# Patient Record
Sex: Female | Born: 2000
Health system: Southern US, Community
[De-identification: ages and names within clinical notes are randomized; demographics above are authoritative.]

## PROBLEM LIST (undated history)

## (undated) DIAGNOSIS — F419 Anxiety disorder, unspecified: Secondary | ICD-10-CM

## (undated) DIAGNOSIS — K219 Gastro-esophageal reflux disease without esophagitis: Secondary | ICD-10-CM

---
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
## 2021-02-28 ENCOUNTER — Ambulatory Visit: Payer: Self-pay

## 2021-03-08 ENCOUNTER — Other Ambulatory Visit: Payer: Self-pay

## 2021-03-08 ENCOUNTER — Ambulatory Visit
Admission: RE | Admit: 2021-03-08 | Discharge: 2021-03-08 | Disposition: A | Discharge status: Home / Self Care | Payer: Managed Care, Other (non HMO) | Source: Ambulatory Visit | Attending: Family Medicine | Admitting: Family Medicine

## 2021-03-08 VITALS — BP 128/91 | HR 108 | Temp 98.8°F | Resp 19

## 2021-03-08 DIAGNOSIS — J019 Acute sinusitis, unspecified: Secondary | ICD-10-CM | POA: Diagnosis not present

## 2021-03-08 MED ORDER — AMOXICILLIN-POT CLAVULANATE 875-125 MG PO TABS: 1.0000 | ORAL_TABLET | Freq: Two times a day (BID) | ORAL | 0 refills | Status: DC

## 2021-03-08 MED ORDER — CETIRIZINE-PSEUDOEPHEDRINE ER 5-120 MG PO TB12: 1.0000 | ORAL_TABLET | Freq: Two times a day (BID) | ORAL | 0 refills | Status: DC

## 2021-03-08 MED ORDER — FLUCONAZOLE 150 MG PO TABS: 150.0000 mg | ORAL_TABLET | Freq: Once | ORAL | 0 refills | Status: AC

## 2021-03-08 NOTE — ED Triage Notes (Addendum)
Pt presents with complaints of nasal congestion x 2 weeks and non productive cough. Patient states that her congestion get significantly worse at night. Denies any headache, pains, or fever. Reports she went to her doctor 2 days ago and was started on a zpack with no relief. Reports using Flonase and a humidifier with no relief.

## 2021-03-08 NOTE — ED Provider Notes (Signed)
[facility]    CSN: [id number] Arrival date & time: 03/08/21  1856      History   Chief Complaint Chief Complaint  Patient presents with  . Nasal Congestion   HPI  20 year old female presents with congestion and cough.  2-week history of symptoms.  She reports severe nasal congestion which is worse at night.  Associated nonproductive cough.  No fever.  She has taken Flonase and used a Nettie pot as well as occasional Afrin without relief.  Was recently put on azithromycin and has had no improvement.  No fever.  No reported sick contacts.  No relieving factors.  No other complaints.  Past Medical History:  Diagnosis Date  . Acid reflux   . Anxiety     Home Medications    Prior to Admission medications   Medication Sig Start Date End Date Taking? Authorizing Provider  amoxicillin-clavulanate (AUGMENTIN) 875-125 MG tablet Take 1 tablet by mouth 2 (two) times daily. 03/08/21  Yes Priyah Schmuck G, DO  cetirizine-pseudoephedrine (ZYRTEC-D) 5-120 MG tablet Take 1 tablet by mouth 2 (two) times daily. 03/08/21  Yes Zakaiya Lares G, DO  fluconazole (DIFLUCAN) 150 MG tablet Take 1 tablet (150 mg total) by mouth once for 1 dose. Repeat dose in 72 hours. 03/08/21 03/08/21 Yes Deyna Carbon G, DO  pantoprazole (PROTONIX) 40 MG tablet Take by mouth. 07/26/20 01/22/21  [provider]  dicyclomine (BENTYL) 10 MG capsule Take by mouth. 07/28/20 03/08/21  [provider]  escitalopram (LEXAPRO) 5 MG tablet Take 5 mg by mouth daily. 07/29/20 03/08/21  [provider]  sucralfate (CARAFATE) 1 GM/10ML suspension Take by mouth. 07/05/20 03/08/21  [provider]    Family History Family History  Problem Relation Age of Onset  . Healthy Mother   . Healthy Father     Social History Social History   Tobacco Use  . Smoking status: Never Smoker  . Smokeless tobacco: Never Used  Substance Use Topics  . Alcohol use: Never  . Drug use: Never     Allergies    Patient has no known allergies.   Review of Systems Review of Systems  HENT: Positive for congestion.   Respiratory: Positive for cough.    Physical Exam Triage Vital Signs ED Triage Vitals  Enc Vitals Group     BP 03/08/21 1905 (!) 128/91     Pulse Rate 03/08/21 1905 (!) 108     Resp 03/08/21 1905 19     Temp 03/08/21 1905 98.8 F (37.1 C)     Temp src --      SpO2 03/08/21 1905 99 %     Weight --      Height --      Head Circumference --      Peak Flow --      Pain Score 03/08/21 1903 0     Pain Loc --      Pain Edu? --      Excl. in GC? --    Updated Vital Signs BP (!) 128/91   Pulse (!) 108   Temp 98.8 F (37.1 C)   Resp 19   LMP 02/22/2021   SpO2 99%   Visual Acuity Right Eye Distance:   Left Eye Distance:   Bilateral Distance:    Right Eye Near:   Left Eye Near:    Bilateral Near:     Physical Exam Vitals and nursing note reviewed.  Constitutional:      General: She is  not in acute distress.    Appearance: Normal appearance. She is not ill-appearing.  HENT:     Head: Normocephalic and atraumatic.     Right Ear: Tympanic membrane normal.     Left Ear: Tympanic membrane normal.     Nose: Congestion present.  Eyes:     General:        Right eye: No discharge.        Left eye: No discharge.     Conjunctiva/sclera: Conjunctivae normal.  Cardiovascular:     Rate and Rhythm: Regular rhythm. Tachycardia present.  Pulmonary:     Effort: Pulmonary effort is normal.     Breath sounds: Normal breath sounds. No wheezing, rhonchi or rales.  Neurological:     Mental Status: She is alert.  Psychiatric:        Mood and Affect: Mood normal.        Behavior: Behavior normal.    UC Treatments / Results  Labs (all labs ordered are listed, but only abnormal results are displayed) Labs Reviewed - No data to display  EKG   Radiology No results found.  Procedures Procedures (including critical care time)  Medications Ordered in UC Medications -  No data to display  Initial Impression / Assessment and Plan / UC Course  I have reviewed the triage vital signs and the nursing notes.  Pertinent labs & imaging results that were available during my care of the patient were reviewed by me and considered in my medical decision making (see chart for details).    20 year old female presents with sinusitis.  Stop azithromycin.  Start Augmentin.  Zyrtec-D as prescribed.  Diflucan if needed for yeast vaginitis.  Final Clinical Impressions(s) / UC Diagnoses   Final diagnoses:  Acute sinusitis, recurrence not specified, unspecified location     Discharge Instructions     Medications as prescribed.  Cal with concerns.  Take care  Dr. Adriana Simas   ED Prescriptions    Medication Sig Dispense Auth. Provider   amoxicillin-clavulanate (AUGMENTIN) 875-125 MG tablet Take 1 tablet by mouth 2 (two) times daily. 20 tablet Carrolyn Hilmes G, DO   fluconazole (DIFLUCAN) 150 MG tablet Take 1 tablet (150 mg total) by mouth once for 1 dose. Repeat dose in 72 hours. 2 tablet Adria Costley G, DO   cetirizine-pseudoephedrine (ZYRTEC-D) 5-120 MG tablet Take 1 tablet by mouth 2 (two) times daily. 30 tablet Tommie Sams, DO     PDMP not reviewed this encounter.   Tommie Sams, [place] 03/08/21 2010

## 2021-03-08 NOTE — Discharge Instructions (Signed)
Medications as prescribed.  Cal with concerns.  Take care  Dr. Adriana Simas

## 2021-03-18 ENCOUNTER — Ambulatory Visit: Payer: Self-pay

## 2021-05-03 ENCOUNTER — Ambulatory Visit
Admission: RE | Admit: 2021-05-03 | Discharge: 2021-05-03 | Disposition: A | Discharge status: Home / Self Care | Payer: Managed Care, Other (non HMO) | Source: Ambulatory Visit | Attending: Physician Assistant | Admitting: Physician Assistant

## 2021-05-03 ENCOUNTER — Other Ambulatory Visit: Payer: Self-pay

## 2021-05-03 ENCOUNTER — Ambulatory Visit (INDEPENDENT_AMBULATORY_CARE_PROVIDER_SITE_OTHER): Payer: Managed Care, Other (non HMO)

## 2021-05-03 VITALS — BP 137/77 | HR 94 | Temp 98.4°F | Resp 18 | Wt 170.0 lb

## 2021-05-03 DIAGNOSIS — R0989 Other specified symptoms and signs involving the circulatory and respiratory systems: Secondary | ICD-10-CM | POA: Diagnosis not present

## 2021-05-03 DIAGNOSIS — R0982 Postnasal drip: Secondary | ICD-10-CM | POA: Diagnosis not present

## 2021-05-03 DIAGNOSIS — J309 Allergic rhinitis, unspecified: Secondary | ICD-10-CM | POA: Diagnosis not present

## 2021-05-03 DIAGNOSIS — R059 Cough, unspecified: Secondary | ICD-10-CM

## 2021-05-03 MED ORDER — IPRATROPIUM BROMIDE 0.06 % NA SOLN: 2.0000 | Freq: Four times a day (QID) | NASAL | 1 refills | Status: DC

## 2021-05-03 MED ORDER — MONTELUKAST SODIUM 10 MG PO TABS: 10.0000 mg | ORAL_TABLET | Freq: Every day | ORAL | 0 refills | Status: DC

## 2021-05-03 NOTE — ED Provider Notes (Signed)
[facility]    CSN: [id number] Arrival date & time: 05/03/21  1829      History   Chief Complaint Chief Complaint  Patient presents with  . Cough  . Nasal Congestion    HPI MIQUELA COSTABILE is a 20 y.o. female presenting with her mother for approximately 23-month history of cough and nasal congestion.  Patient has taken antibiotics multiple times and states symptoms improve when she takes antibiotics but then return after..  Patient was seen at [facility] urgent care 2 months ago and treated with Augmentin for suspected sinus infection.  She reported that she was treated with azithromycin before that.  Patient was recently treated last week with azithromycin.  She takes daily Zyrtec-D and uses Flonase every day.  She also occasionally uses nasal saline rinses.  She has a humidifier at her bedside.  Patient has reportedly been diagnosed with sinus infections by her PCP and given azithromycin on multiple occasions.  She has not had any allergy testing.  Patient denies any acute worsening of her symptoms recently, but concerned because they are ongoing so long.  She has not had any fevers, fatigue, achiness, sore throat, chest pain, shortness of breath.  Patient recently got a new puppy couple months ago but has had a dog before that and never had any sort of symptoms such as these.  She has not been knowingly exposed to any mold or anything else that could be causing her to have allergies other than seasonal.  Past medical history significant for anxiety.  She takes Lexapro for this.  Additionally, she has history of chronic chest pain related to acid reflux and recurrent costochondritis.  Patient takes Protonix for her acid reflux.  These conditions are reportedly under good control.  There are no other concerns today.  HPI  Past Medical History:  Diagnosis Date  . Acid reflux   . Anxiety     There are no problems to display for this patient.   History reviewed. No pertinent  surgical history.  OB History   No obstetric history on file.      Home Medications    Prior to Admission medications   Medication Sig Start Date End Date Taking? Authorizing Provider  cetirizine-pseudoephedrine (ZYRTEC-D) 5-120 MG tablet Take 1 tablet by mouth 2 (two) times daily. 03/08/21  Yes Cook, Jayce G, DO  ipratropium (ATROVENT) 0.06 % nasal spray Place 2 sprays into both nostrils 4 (four) times daily for 15 days. 05/03/21 05/18/21 Yes Eusebio Friendly B, PA-C  montelukast (SINGULAIR) 10 MG tablet Take 1 tablet (10 mg total) by mouth at bedtime. 05/03/21 06/02/21 Yes Eusebio Friendly B, PA-C  pantoprazole (PROTONIX) 40 MG tablet Take by mouth. 07/26/20 01/22/21  [provider]  dicyclomine (BENTYL) 10 MG capsule Take by mouth. 07/28/20 03/08/21  [provider]  escitalopram (LEXAPRO) 5 MG tablet Take 5 mg by mouth daily. 07/29/20 03/08/21  [provider]  sucralfate (CARAFATE) 1 GM/10ML suspension Take by mouth. 07/05/20 03/08/21  [provider]    Family History Family History  Problem Relation Age of Onset  . Healthy Mother   . Healthy Father     Social History Social History   Tobacco Use  . Smoking status: Never Smoker  . Smokeless tobacco: Never Used  Substance Use Topics  . Alcohol use: Never  . Drug use: Never     Allergies   Patient has no known allergies.   Review of Systems Review of Systems  Constitutional: Negative for chills, diaphoresis, fatigue and fever.  HENT: Positive for congestion, postnasal drip and rhinorrhea. Negative for ear pain, sinus pressure, sinus pain and sore throat.   Respiratory: Positive for cough. Negative for shortness of breath.   Cardiovascular: Negative for chest pain.  Gastrointestinal: Negative for abdominal pain, nausea and vomiting.  Musculoskeletal: Negative for arthralgias and myalgias.  Skin: Negative for rash.  Neurological: Negative for weakness and headaches.  Hematological: Negative for  adenopathy.     Physical Exam Triage Vital Signs ED Triage Vitals [05/03/21 1841]  Enc Vitals Group     BP      Pulse      Resp      Temp      Temp src      SpO2      Weight 170 lb (77.1 kg)     Height      Head Circumference      Peak Flow      Pain Score 0     Pain Loc      Pain Edu?      Excl. in GC?    No data found.  Updated Vital Signs BP 137/77 (BP Location: Left Arm)   Pulse 94   Temp 98.4 F (36.9 C) (Oral)   Resp 18   Wt 170 lb (77.1 kg)   LMP 04/26/2021   SpO2 100%   BMI 28.29 kg/m    Physical Exam Vitals and nursing note reviewed.  Constitutional:      General: She is not in acute distress.    Appearance: Normal appearance. She is not ill-appearing or toxic-appearing.  HENT:     Head: Normocephalic and atraumatic.     Right Ear: Tympanic membrane, ear canal and external ear normal.     Left Ear: Tympanic membrane, ear canal and external ear normal.     Nose: Congestion present. No rhinorrhea.     Mouth/Throat:     Mouth: Mucous membranes are moist.     Pharynx: Oropharynx is clear. Posterior oropharyngeal erythema (mild with trace clear PND) present.  Eyes:     General: No scleral icterus.       Right eye: No discharge.        Left eye: No discharge.     Conjunctiva/sclera: Conjunctivae normal.  Cardiovascular:     Rate and Rhythm: Normal rate and regular rhythm.     Heart sounds: Normal heart sounds.  Pulmonary:     Effort: Pulmonary effort is normal. No respiratory distress.     Breath sounds: Normal breath sounds. No wheezing, rhonchi or rales.  Musculoskeletal:     Cervical back: Neck supple.  Skin:    General: Skin is dry.  Neurological:     General: No focal deficit present.     Mental Status: She is alert. Mental status is at baseline.     Motor: No weakness.     Gait: Gait normal.  Psychiatric:        Mood and Affect: Mood normal.        Behavior: Behavior normal.        Thought Content: Thought content normal.      UC  Treatments / Results  Labs (all labs ordered are listed, but only abnormal results are displayed) Labs Reviewed - No data to display  EKG   Radiology DG Chest 2 View  Result Date: 05/03/2021 CLINICAL DATA:  Cough congestion EXAM: CHEST - 2 VIEW COMPARISON:  10/28/2020 FINDINGS: The heart  size and mediastinal contours are within normal limits. Both lungs are clear. The visualized skeletal structures are unremarkable. IMPRESSION: No active cardiopulmonary disease. Electronically Signed   By: Jasmine Pang M.D.   On: 05/03/2021 19:11    Procedures Procedures (including critical care time)  Medications Ordered in UC Medications - No data to display  Initial Impression / Assessment and Plan / UC Course  I have reviewed the triage vital signs and the nursing notes.  Pertinent labs & imaging results that were available during my care of the patient were reviewed by me and considered in my medical decision making (see chart for details).   20 year old female presenting for 42-month history of cough, congestion and postnasal drainage.  All vital signs are normal and stable in the clinic today.  She does have some minor nasal congestion on exam without any rhinorrhea.  She does have mild posterior pharyngeal erythema with trace clear postnasal drainage.  Her chest is clear to auscultation and heart regular rate and rhythm.  Obtaining a chest x-ray due to duration of symptoms.  Chest x-ray reviewed by me.  Overread interpretation is within normal limits.  Reviewed this result with patient and parent.  Advised patient that her symptoms are most consistent with allergic rhinitis, most likely seasonal but could be due to other causes such as pet dander, molds.  Advised to continue taking the Zyrtec-D and using nasal saline.  Advised can try Atrovent nasal spray instead this time.  We will try her on Singulair at bedtime to see if that helps.  Advised she can also take Benadryl if needed here and there.   Advised to make sure she stays hydrated to continue using the humidifier.  Advised if symptoms continue on in December and then she should see her PCP again and request possible referral to allergist or have allergy testing panel performed.  Reviewed signs and symptoms of acute bacterial sinus infection with patient and advised her to return for any other symptoms.  She denies any sinus pain.  Advised to follow-up with [place] as needed, otherwise to follow-up PCP regarding these persistent symptoms.  Final Clinical Impressions(s) / UC Diagnoses   Final diagnoses:  Allergic rhinitis, unspecified seasonality, unspecified trigger  Cough  Post-nasal drainage     Discharge Instructions     Your symptoms seem most consistent with seasonal type allergies to me.  Continue the Zyrtec the and nasal saline.  We can try another nasal spray and Singulair.  You can also take Benadryl at bedtime.  Continue to use your humidifier.  Your symptoms should improve and eventually resolve once we get into summer.  However, if it continues you may need to be tested for certain allergies to see if it is a response to something else such as pet dander.      ED Prescriptions    Medication Sig Dispense Auth. Provider   ipratropium (ATROVENT) 0.06 % nasal spray Place 2 sprays into both nostrils 4 (four) times daily for 15 days. 12 mL Eusebio Friendly B, PA-C   montelukast (SINGULAIR) 10 MG tablet Take 1 tablet (10 mg total) by mouth at bedtime. 30 tablet Gareth Morgan     PDMP not reviewed this encounter.   Shirlee Latch, PA-C 05/03/21 1936

## 2021-05-03 NOTE — ED Triage Notes (Signed)
Patient c/o cough and congestion that started over 2 months ago. She states she will take antibiotics and it will get better and then her symptoms will return.

## 2021-05-03 NOTE — Discharge Instructions (Signed)
Your symptoms seem most consistent with seasonal type allergies to me.  Continue the Zyrtec the and nasal saline.  We can try another nasal spray and Singulair.  You can also take Benadryl at bedtime.  Continue to use your humidifier.  Your symptoms should improve and eventually resolve once we get into summer.  However, if it continues you may need to be tested for certain allergies to see if it is a response to something else such as pet dander.

## 2021-11-13 ENCOUNTER — Other Ambulatory Visit: Payer: Self-pay

## 2021-11-13 ENCOUNTER — Ambulatory Visit
Admission: EM | Admit: 2021-11-13 | Discharge: 2021-11-13 | Disposition: A | Discharge status: Home / Self Care | Payer: Managed Care, Other (non HMO) | Attending: Emergency Medicine | Admitting: Emergency Medicine

## 2021-11-13 DIAGNOSIS — Z20822 Contact with and (suspected) exposure to covid-19: Secondary | ICD-10-CM | POA: Diagnosis not present

## 2021-11-13 DIAGNOSIS — J029 Acute pharyngitis, unspecified: Secondary | ICD-10-CM

## 2021-11-13 LAB — RESP PANEL BY RT-PCR (FLU A&B, COVID) ARPGX2
Influenza A by PCR: NEGATIVE
Influenza B by PCR: NEGATIVE
SARS Coronavirus 2 by RT PCR: NEGATIVE

## 2021-11-13 LAB — GROUP A STREP BY PCR: Group A Strep by PCR: NOT DETECTED

## 2021-11-13 MED ORDER — IBUPROFEN 600 MG PO TABS: 600.0000 mg | ORAL_TABLET | Freq: Four times a day (QID) | ORAL | 0 refills | Status: DC | PRN

## 2021-11-13 NOTE — Discharge Instructions (Addendum)
Your strep PCR is negative.  We are going to check a rapid flu on you, I will contact you if it is positive and will call in Tamiflu.  The COVID is a send out test.  Will be back tomorrow.  I will prescribe Molnupiravir if it is positive.  In the meantime, 1 gram of Tylenol and 600 mg ibuprofen together 3-4 times a day as needed for pain.  Make sure you drink plenty of extra fluids.  Some people find salt water gargles and  Traditional Medicinal's "Throat Coat" tea helpful. Take 5 mL of liquid Benadryl and 5 mL of Maalox. Mix it together, and then hold it in your mouth for as long as you can and then swallow. You may do this 4 times a day.    Go to www.goodrx.com  or www.costplusdrugs.com to look up your medications. This will give you a list of where you can find your prescriptions at the most affordable prices. Or ask the pharmacist what the cash price is, or if they have any other discount programs available to help make your medication more affordable. This can be less expensive than what you would pay with insurance.

## 2021-11-13 NOTE — ED Provider Notes (Signed)
HPI  SUBJECTIVE:  Patient reports sore throat starting yesterday.  No aggravating or alleviating factors.  She has tried Alka-Seltzer cold and flu without improvement in her symptoms. No fever   No neck stiffness  No Cough No nasal congestion, rhinorrhea No Myalgias No Headache No Rash  No loss of taste or smell No shortness of breath or difficulty breathing No nausea, vomiting No diarrhea No abdominal pain     No Recent Strep, flu, COVID exposure She did not get the COVID-vaccine.  She got the flu vaccine. No reflux sxs No Allergy sxs  No Breathing difficulty, voice changes, sensation of throat swelling shut No Drooling No Trismus No abx in past month.  No antipyretic in the past 6 hours. Past medical history of frequent strep.   LMP: 2 weeks ago.  Denies possibility being pregnant. PMD: [facility].   Past Medical History:  Diagnosis Date   Acid reflux    Anxiety     History reviewed. No pertinent surgical history.  Family History  Problem Relation Age of Onset   Healthy Mother    Healthy Father     Social History   Tobacco Use   Smoking status: Never   Smokeless tobacco: Never  Vaping Use   Vaping Use: Never used  Substance Use Topics   Alcohol use: Never   Drug use: Never    No current facility-administered medications for this encounter.  Current Outpatient Medications:    ibuprofen (ADVIL) 600 MG tablet, Take 1 tablet (600 mg total) by mouth every 6 (six) hours as needed., Disp: 30 tablet, Rfl: 0   ipratropium (ATROVENT) 0.06 % nasal spray, Place 2 sprays into both nostrils 4 (four) times daily for 15 days., Disp: 12 mL, Rfl: 1   montelukast (SINGULAIR) 10 MG tablet, Take 1 tablet (10 mg total) by mouth at bedtime., Disp: 30 tablet, Rfl: 0   pantoprazole (PROTONIX) 40 MG tablet, Take by mouth., Disp: , Rfl:   No Known Allergies   ROS  As noted in HPI.   Physical Exam  BP 123/75 (BP Location: Left Arm)   Pulse 79   Temp 97.9  F (36.6 C) (Oral)   Resp 16   Ht 5\' 5"  (1.651 m)   Wt 77.1 kg   LMP 10/30/2021   SpO2 99%   BMI 28.29 kg/m   Constitutional: Well developed, well nourished, no acute distress Eyes:  EOMI, conjunctiva normal bilaterally HENT: Normocephalic, atraumatic,mucus membranes moist. +  nasal congestion + slightly erythematous oropharynx normal tonsils without exudates.   Uvula midline.  Respiratory: Normal inspiratory effort Cardiovascular: Normal rate, no murmurs, rubs, gallops GI: nondistended, nontender. No appreciable splenomegaly skin: No rash, skin intact Lymph: -  Anterior cervical LN.  No posterior cervical lymphadenopathy Musculoskeletal: no deformities Neurologic: Alert & oriented x 3, no focal neuro deficits Psychiatric: Speech and behavior appropriate. ED Course   Medications - No data to display  Orders Placed This Encounter  Procedures   Group A Strep by PCR    Standing Status:   Standing    Number of Occurrences:   1   Resp Panel by RT-PCR (Flu A&B, Covid) Nasopharyngeal Swab    Standing Status:   Standing    Number of Occurrences:   1   Airborne and Contact precautions    Standing Status:   Standing    Number of Occurrences:   1    Results for orders placed or performed during the hospital encounter of 11/13/21 (from the  past 24 hour(s))  Group A Strep by PCR     Status: None   Collection Time: 11/13/21  8:59 AM   Specimen: Throat; Sterile Swab  Result Value Ref Range   Group A Strep by PCR NOT DETECTED NOT DETECTED  Resp Panel by RT-PCR (Flu A&B, Covid) Nasopharyngeal Swab     Status: None   Collection Time: 11/13/21  9:50 AM   Specimen: Nasopharyngeal Swab; Nasopharyngeal(NP) swabs in vial transport medium  Result Value Ref Range   SARS Coronavirus 2 by RT PCR NEGATIVE NEGATIVE   Influenza A by PCR NEGATIVE NEGATIVE   Influenza B by PCR NEGATIVE NEGATIVE   No results found.  ED Clinical Impression  1. Acute pharyngitis, unspecified etiology   2.  Encounter for laboratory testing for COVID-19 virus      ED Assessment/Plan  Strep PCR negative.  COVID, flu PCR also negative.  Suspect viral pharyngitis.  Tylenol/ibuprofen, Benadryl/Maalox mixture, work note for tomorrow.  ER return precautions given .  Discussed labs,  MDM, plan and followup with patient. Discussed sn/sx that should prompt return to the ED. patient agrees with plan.   Meds ordered this encounter  Medications   ibuprofen (ADVIL) 600 MG tablet    Sig: Take 1 tablet (600 mg total) by mouth every 6 (six) hours as needed.    Dispense:  30 tablet    Refill:  0     *This clinic note was created using Scientist, clinical (histocompatibility and immunogenetics). Therefore, there may be occasional mistakes despite careful proofreading.     Domenick Gong, MD 11/14/21 [phone]

## 2021-11-13 NOTE — ED Triage Notes (Signed)
Pt here with C/O Sore throat since yesterday. Denies fever, nasal congestion or chest congestion.

## 2021-11-16 ENCOUNTER — Other Ambulatory Visit: Payer: Self-pay

## 2021-11-16 ENCOUNTER — Ambulatory Visit
Admission: EM | Admit: 2021-11-16 | Discharge: 2021-11-16 | Disposition: A | Discharge status: Home / Self Care | Payer: Managed Care, Other (non HMO) | Attending: Student | Admitting: Student

## 2021-11-16 ENCOUNTER — Encounter: Payer: Self-pay | Admitting: Emergency Medicine

## 2021-11-16 DIAGNOSIS — J069 Acute upper respiratory infection, unspecified: Secondary | ICD-10-CM

## 2021-11-16 DIAGNOSIS — J029 Acute pharyngitis, unspecified: Secondary | ICD-10-CM

## 2021-11-16 MED ORDER — LIDOCAINE VISCOUS HCL 2 % MT SOLN: 15.0000 mL | OROMUCOSAL | 0 refills | Status: DC | PRN

## 2021-11-16 NOTE — Discharge Instructions (Addendum)
-  Continue Z-pak as previously prescribed. -Use viscous lidocaine as needed for sore throat. -Follow-up if no improvement of symptoms.

## 2021-11-16 NOTE — ED Provider Notes (Signed)
[facility]    CSN: [id number] Arrival date & time: 11/16/21  1720      History   Chief Complaint Chief Complaint  Patient presents with   Sore Throat    HPI Marissa Harrington is a 20 y.o. female who presents today for evaluation of ongoing sore throat.  The patient was evaluated 3 days ago at the [facility] urgent care where she underwent COVID testing which was negative, influenza negative and negative strep.  The patient was then evaluated by her primary care yesterday who started the patient on a Z-Pak for upper respiratory infection.  The patient presents today denying any changes to her symptoms.  She states that her sore throat is slightly worse but she reports no significant worsening of her symptoms and no improvement at this time.  The patient does report a runny nose, she does report a mild headache.  She does report a productive cough.  She denies any chest pain or shortness of breath.  No vision changes.  No hearing changes at this time.  She denies any fevers or chills at home.   Sore Throat Pertinent negatives include no chest pain and no shortness of breath.   Past Medical History:  Diagnosis Date   Acid reflux    Anxiety     There are no problems to display for this patient.   History reviewed. No pertinent surgical history.  OB History   No obstetric history on file.      Home Medications    Prior to Admission medications   Medication Sig Start Date End Date Taking? Authorizing Provider  ibuprofen (ADVIL) 600 MG tablet Take 1 tablet (600 mg total) by mouth every 6 (six) hours as needed. 11/13/21  Yes Domenick Gong, MD  lidocaine (XYLOCAINE) 2 % solution Use as directed 15 mLs in the mouth or throat as needed for mouth pain. 11/16/21  Yes Anson Oregon, PA-C  ipratropium (ATROVENT) 0.06 % nasal spray Place 2 sprays into both nostrils 4 (four) times daily for 15 days. 05/03/21 05/18/21  Eusebio Friendly B, PA-C  montelukast (SINGULAIR) 10  MG tablet Take 1 tablet (10 mg total) by mouth at bedtime. 05/03/21 06/02/21  Eusebio Friendly B, PA-C  pantoprazole (PROTONIX) 40 MG tablet Take by mouth. 07/26/20 01/22/21  [provider]  dicyclomine (BENTYL) 10 MG capsule Take by mouth. 07/28/20 03/08/21  [provider]  escitalopram (LEXAPRO) 5 MG tablet Take 5 mg by mouth daily. 07/29/20 03/08/21  [provider]  sucralfate (CARAFATE) 1 GM/10ML suspension Take by mouth. 07/05/20 03/08/21  [provider]    Family History Family History  Problem Relation Age of Onset   Healthy Mother    Healthy Father     Social History Social History   Tobacco Use   Smoking status: Never   Smokeless tobacco: Never  Vaping Use   Vaping Use: Never used  Substance Use Topics   Alcohol use: Never   Drug use: Never   Allergies   Patient has no known allergies.   Review of Systems Review of Systems  Constitutional:  Negative for fever.  HENT:  Positive for rhinorrhea and sore throat.   Respiratory:  Positive for cough. Negative for shortness of breath.   Cardiovascular:  Negative for chest pain.  Gastrointestinal:  Negative for diarrhea, nausea and vomiting.  All other systems reviewed and are negative.  Physical Exam Triage Vital Signs ED Triage Vitals  Enc Vitals Group  BP 11/16/21 1807 130/78     Pulse Rate 11/16/21 1807 (!) 108     Resp 11/16/21 1807 18     Temp 11/16/21 1807 98.3 F (36.8 C)     Temp Source 11/16/21 1807 Oral     SpO2 11/16/21 1807 100 %     Weight 11/16/21 1805 169 lb 15.6 oz (77.1 kg)     Height 11/16/21 1805 5\' 5"  (1.651 m)     Head Circumference --      Peak Flow --      Pain Score 11/16/21 1804 10     Pain Loc --      Pain Edu? --      Excl. in GC? --    No data found.  Updated Vital Signs BP 130/78 (BP Location: Left Arm)   Pulse (!) 108   Temp 98.3 F (36.8 C) (Oral)   Resp 18   Ht 5\' 5"  (1.651 m)   Wt 169 lb 15.6 oz (77.1 kg)   LMP 10/30/2021 (Approximate)    SpO2 100%   BMI 28.29 kg/m   Visual Acuity Right Eye Distance:   Left Eye Distance:   Bilateral Distance:    Right Eye Near:   Left Eye Near:    Bilateral Near:     Physical Exam Constitutional:      Appearance: She is not ill-appearing or toxic-appearing.  HENT:     Right Ear: Tympanic membrane normal.     Left Ear: Tympanic membrane normal.     Nose: Congestion and rhinorrhea present.     Comments: Mild erythema to bilateral nasal canals.     Mouth/Throat:     Mouth: Mucous membranes are moist.     Pharynx: Oropharynx is clear. No pharyngeal swelling, oropharyngeal exudate or posterior oropharyngeal erythema.  Cardiovascular:     Rate and Rhythm: Normal rate and regular rhythm.     Heart sounds: No murmur heard.   No friction rub. No gallop.  Pulmonary:     Effort: No respiratory distress.     Breath sounds: Normal breath sounds. No stridor. No wheezing.  Abdominal:     General: Bowel sounds are normal.     Palpations: Abdomen is soft.  Neurological:     Mental Status: She is alert.   UC Treatments / Results  Labs (all labs ordered are listed, but only abnormal results are displayed) Labs Reviewed - No data to display  EKG   Radiology No results found.  Procedures Procedures (including critical care time)  Medications Ordered in UC Medications - No data to display  Initial Impression / Assessment and Plan / UC Course  I have reviewed the triage vital signs and the nursing notes.  Pertinent labs & imaging results that were available during my care of the patient were reviewed by me and considered in my medical decision making (see chart for details).     1.  Treatment options were discussed today with the patient and her mother. 2.  The patient was started yesterday on a Z-Pak for presumed upper respiratory infection. 3.  The patient's throat is clear without exudate.  Moderate erythema to bilateral nasal canals. 4.  Instructed the patient to continue  Z-Pak at this time.  She was prescribed viscous lidocaine to begin using as well. 5.  Follow-up with primary care physician if no changes of symptoms or worsening of symptoms. Final Clinical Impressions(s) / UC Diagnoses   Final diagnoses:  Sore throat  Upper respiratory  tract infection, unspecified type     Discharge Instructions      -Continue Z-pak as previously prescribed. -Use viscous lidocaine as needed for sore throat. -Follow-up if no improvement of symptoms.   ED Prescriptions     Medication Sig Dispense Auth. Provider   lidocaine (XYLOCAINE) 2 % solution Use as directed 15 mLs in the mouth or throat as needed for mouth pain. 100 mL Anson Oregon, PA-C      PDMP not reviewed this encounter.   Anson Oregon, [place] 11/16/21 1947

## 2021-11-16 NOTE — ED Triage Notes (Addendum)
Pt c/o sore throat, runny nose, nasal congestion, and cough. Denies fever. Started 5 days ago. Pt was seen on 11/13/21 and tested for covid flu and strep, all negative. Pt mother states pt went to her PCP yesterday and was given a zpak, she has only taken 1 pill.

## 2021-11-21 ENCOUNTER — Other Ambulatory Visit: Payer: Self-pay

## 2021-11-21 ENCOUNTER — Ambulatory Visit
Admission: RE | Admit: 2021-11-21 | Discharge: 2021-11-21 | Disposition: A | Discharge status: Home / Self Care | Payer: Managed Care, Other (non HMO) | Source: Ambulatory Visit | Attending: Emergency Medicine | Admitting: Emergency Medicine

## 2021-11-21 VITALS — BP 120/74 | HR 96 | Temp 98.4°F | Resp 18 | Ht 65.0 in | Wt 170.0 lb

## 2021-11-21 DIAGNOSIS — R058 Other specified cough: Secondary | ICD-10-CM

## 2021-11-21 MED ORDER — PROMETHAZINE-DM 6.25-15 MG/5ML PO SYRP: 5.0000 mL | ORAL_SOLUTION | Freq: Four times a day (QID) | ORAL | 0 refills | Status: DC | PRN

## 2021-11-21 MED ORDER — CETIRIZINE HCL 10 MG PO TABS: 10.0000 mg | ORAL_TABLET | Freq: Every day | ORAL | 0 refills | Status: DC

## 2021-11-21 MED ORDER — BENZONATATE 100 MG PO CAPS: 100.0000 mg | ORAL_CAPSULE | Freq: Three times a day (TID) | ORAL | 0 refills | Status: DC

## 2021-11-21 NOTE — Discharge Instructions (Addendum)
Your symptoms today are a result of your recent throat irritation and viral illness, the mucus from your nasal congestion is irritating your throat and due to the irritation in your throat is producing mucus to protect it, the increased mucus is causing you to cough, this is a never-ending cycle until the throat heals itself, this process may take up to 7 to 14 days  You may use Tessalon every 8 hours to help calm your coughing  You may use the cough syrup every 6 hours to help calm your coughing, please be mindful that for some people this medication has a sedative effect, take a dose before bedtime to see if this helps you sleep  You may use Zyrtec daily to help reduce mucus production  Maintaining adequate hydration may help to thin secretions and soothe the respiratory mucosa   Warm Liquids- Ingestion of warm liquids may have a soothing effect on the respiratory mucosa, increase the flow of nasal mucus, and loosen respiratory secretions, making them easier to remove  May try honey (2.5 to 5 mL [0.5 to 1 teaspoon]) can be given straight or diluted in liquid (juice). Corn syrup may be substituted if honey is not available.    You may sit in a steamed shower which makes it easier to breath and may calm your throat.

## 2021-11-21 NOTE — ED Provider Notes (Addendum)
[facility]    CSN: [id number] Arrival date & time: 11/21/21  1800      History   Chief Complaint Chief Complaint  Patient presents with   Cough    6pm APPOINTMENT    HPI Marissa Harrington is a 20 y.o. female.   Patient presents with nasal congestion, rhinorrhea and nonproductive cough for 3 days.  Symptoms worsen at nighttime interfering with sleep.  Attempted use of Mucinex, alka seltzer cold and flu, no relief.  No known sick contacts.  Since 11/13/2021 has been seen in urgent care twice and diagnosed with viral pharyngitis. Was prescribed azithromycin from PCP for upper respiratory infection, completed antibiotics as prescribed.  Sore throat has resolved.  COVID, flu and rapid strep test negative. history of acid reflux and anxiety.  Denies fever, chills, body aches, ear pain, headaches, abdominal pain, nausea, vomiting, diarrhea, shortness of breath or wheezing.   Past Medical History:  Diagnosis Date   Acid reflux    Anxiety     There are no problems to display for this patient.   History reviewed. No pertinent surgical history.  OB History   No obstetric history on file.      Home Medications    Prior to Admission medications   Medication Sig Start Date End Date Taking? Authorizing Provider  ibuprofen (ADVIL) 600 MG tablet Take 1 tablet (600 mg total) by mouth every 6 (six) hours as needed. 11/13/21  Yes Domenick Gong, MD  lidocaine (XYLOCAINE) 2 % solution Use as directed 15 mLs in the mouth or throat as needed for mouth pain. 11/16/21  Yes Anson Oregon, PA-C  ipratropium (ATROVENT) 0.06 % nasal spray Place 2 sprays into both nostrils 4 (four) times daily for 15 days. 05/03/21 05/18/21  Eusebio Friendly B, PA-C  montelukast (SINGULAIR) 10 MG tablet Take 1 tablet (10 mg total) by mouth at bedtime. 05/03/21 06/02/21  Eusebio Friendly B, PA-C  pantoprazole (PROTONIX) 40 MG tablet Take by mouth. 07/26/20 01/22/21  [provider]  dicyclomine  (BENTYL) 10 MG capsule Take by mouth. 07/28/20 03/08/21  [provider]  escitalopram (LEXAPRO) 5 MG tablet Take 5 mg by mouth daily. 07/29/20 03/08/21  [provider]  sucralfate (CARAFATE) 1 GM/10ML suspension Take by mouth. 07/05/20 03/08/21  [provider]    Family History Family History  Problem Relation Age of Onset   Healthy Mother    Healthy Father     Social History Social History   Tobacco Use   Smoking status: Never   Smokeless tobacco: Never  Vaping Use   Vaping Use: Never used  Substance Use Topics   Alcohol use: Never   Drug use: Never     Allergies   Patient has no known allergies.   Review of Systems Review of Systems  Constitutional: Negative.   HENT:  Positive for congestion and rhinorrhea. Negative for dental problem, drooling, ear discharge, ear pain, facial swelling, hearing loss, mouth sores, nosebleeds, postnasal drip, sinus pressure, sinus pain, sneezing, sore throat, tinnitus, trouble swallowing and voice change.   Respiratory:  Positive for cough. Negative for apnea, choking, chest tightness, shortness of breath, wheezing and stridor.   Cardiovascular: Negative.   Gastrointestinal: Negative.   Skin: Negative.   Neurological: Negative.     Physical Exam Triage Vital Signs ED Triage Vitals  Enc Vitals Group     BP 11/21/21 1812 120/74     Pulse Rate 11/21/21 1812 96     Resp 11/21/21  1811 18     Temp 11/21/21 1812 98.4 F (36.9 C)     Temp Source 11/21/21 1811 Oral     SpO2 11/21/21 1812 100 %     Weight 11/21/21 1811 170 lb (77.1 kg)     Height 11/21/21 1811 5\' 5"  (1.651 m)     Head Circumference --      Peak Flow --      Pain Score 11/21/21 1811 0     Pain Loc --      Pain Edu? --      Excl. in GC? --    No data found.  Updated Vital Signs BP 120/74 (BP Location: Left Arm)   Pulse 96   Temp 98.4 F (36.9 C) (Oral)   Resp 18   Ht 5\' 5"  (1.651 m)   Wt 170 lb (77.1 kg)   LMP 10/30/2021 (Approximate)    SpO2 100%   BMI 28.29 kg/m   Visual Acuity Right Eye Distance:   Left Eye Distance:   Bilateral Distance:    Right Eye Near:   Left Eye Near:    Bilateral Near:     Physical Exam Constitutional:      Appearance: Normal appearance. She is normal weight.  HENT:     Right Ear: Tympanic membrane, ear canal and external ear normal.     Left Ear: Tympanic membrane, ear canal and external ear normal.     Nose: Congestion and rhinorrhea present.     Mouth/Throat:     Mouth: Mucous membranes are moist.     Pharynx: Posterior oropharyngeal erythema present.  Eyes:     Extraocular Movements: Extraocular movements intact.  Cardiovascular:     Rate and Rhythm: Normal rate and regular rhythm.     Pulses: Normal pulses.     Heart sounds: Normal heart sounds.  Pulmonary:     Effort: Pulmonary effort is normal.     Breath sounds: Normal breath sounds.     Comments: Dry cough witnessed during exam Musculoskeletal:     Cervical back: Normal range of motion.  Lymphadenopathy:     Cervical: Cervical adenopathy present.  Skin:    General: Skin is warm and dry.  Neurological:     Mental Status: She is alert and oriented to person, place, and time.  Psychiatric:        Mood and Affect: Mood normal.        Behavior: Behavior normal.     UC Treatments / Results  Labs (all labs ordered are listed, but only abnormal results are displayed) Labs Reviewed - No data to display  EKG   Radiology No results found.  Procedures Procedures (including critical care time)  Medications Ordered in UC Medications - No data to display  Initial Impression / Assessment and Plan / UC Course  I have reviewed the triage vital signs and the nursing notes.  Pertinent labs & imaging results that were available during my care of the patient were reviewed by me and considered in my medical decision making (see chart for details).  Postviral cough syndrome  Discussed etiology of symptoms, timeline  and possible resolution with patient and parent, etiology of symptoms most likely related to throat irritation and mucus production due to recent virus  1.  Tessalon 100 mg 3 times daily as needed 2.  Promethazine DM 6.25-50 mg / 5 mL every 6 hours as needed 3.  Zyrtec 10 mg daily 4.  Urgent care follow-up as needed Final  Clinical Impressions(s) / UC Diagnoses   Final diagnoses:  None   Discharge Instructions   None    ED Prescriptions   None    PDMP not reviewed this encounter.   Valinda Hoar, NP 11/21/21 1855    Valinda Hoar, NP 11/21/21 [phone]

## 2021-11-21 NOTE — ED Triage Notes (Signed)
Pt here with mom, pt c/o cough, unable to sleep well at night.

## 2022-01-03 ENCOUNTER — Ambulatory Visit: Payer: Self-pay

## 2022-01-08 ENCOUNTER — Ambulatory Visit
Admission: EM | Admit: 2022-01-08 | Discharge: 2022-01-08 | Disposition: A | Discharge status: Home / Self Care | Payer: Managed Care, Other (non HMO) | Attending: Emergency Medicine | Admitting: Emergency Medicine

## 2022-01-08 ENCOUNTER — Other Ambulatory Visit: Payer: Self-pay

## 2022-01-08 DIAGNOSIS — J069 Acute upper respiratory infection, unspecified: Secondary | ICD-10-CM | POA: Diagnosis not present

## 2022-01-08 DIAGNOSIS — R0981 Nasal congestion: Secondary | ICD-10-CM | POA: Insufficient documentation

## 2022-01-08 DIAGNOSIS — J029 Acute pharyngitis, unspecified: Secondary | ICD-10-CM | POA: Diagnosis not present

## 2022-01-08 DIAGNOSIS — Z20822 Contact with and (suspected) exposure to covid-19: Secondary | ICD-10-CM | POA: Diagnosis not present

## 2022-01-08 LAB — GROUP A STREP BY PCR: Group A Strep by PCR: NOT DETECTED

## 2022-01-08 LAB — RESP PANEL BY RT-PCR (FLU A&B, COVID) ARPGX2
Influenza A by PCR: NEGATIVE
Influenza B by PCR: NEGATIVE
SARS Coronavirus 2 by RT PCR: NEGATIVE

## 2022-01-08 MED ORDER — LIDOCAINE VISCOUS HCL 2 % MT SOLN: 15.0000 mL | OROMUCOSAL | 0 refills | Status: DC | PRN

## 2022-01-08 MED ORDER — IBUPROFEN 800 MG PO TABS: 800.0000 mg | ORAL_TABLET | Freq: Three times a day (TID) | ORAL | 0 refills | Status: DC

## 2022-01-08 NOTE — ED Provider Notes (Signed)
[facility]    CSN: [id number] Arrival date & time: 01/08/22  0804      History   Chief Complaint Chief Complaint  Patient presents with   Sore Throat   Nasal Congestion    HPI Marissa Harrington is a 21 y.o. female.   Patient presents with nasal congestion for 1 week.  Endorses sore throat that started 2 days ago.  Nasal congestion initially was runny but is now described as a stiffness.  Painful to swallow but able to tolerate food and liquids with decreased appetite.  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  No pertinent medical history.  Has attempted use of Mucinex and Alka-Seltzer which has not been helpful.    Past Medical History:  Diagnosis Date   Acid reflux    Anxiety     There are no problems to display for this patient.   History reviewed. No pertinent surgical history.  OB History   No obstetric history on file.      Home Medications    Prior to Admission medications   Medication Sig Start Date End Date Taking? Authorizing Provider  benzonatate (TESSALON) 100 MG capsule Take 1 capsule (100 mg total) by mouth every 8 (eight) hours. 11/21/21   Hans Eden, NP  cetirizine (ZYRTEC ALLERGY) 10 MG tablet Take 1 tablet (10 mg total) by mouth daily. 11/21/21   Chandler Stofer, Leitha Schuller, NP  ibuprofen (ADVIL) 600 MG tablet Take 1 tablet (600 mg total) by mouth every 6 (six) hours as needed. 11/13/21   Melynda Ripple, MD  ipratropium (ATROVENT) 0.06 % nasal spray Place 2 sprays into both nostrils 4 (four) times daily for 15 days. 05/03/21 05/18/21  Laurene Footman B, PA-C  lidocaine (XYLOCAINE) 2 % solution Use as directed 15 mLs in the mouth or throat as needed for mouth pain. 11/16/21   Lattie Corns, PA-C  montelukast (SINGULAIR) 10 MG tablet Take 1 tablet (10 mg total) by mouth at bedtime. 05/03/21 06/02/21  Laurene Footman B, PA-C  pantoprazole (PROTONIX) 40 MG tablet Take by mouth. 07/26/20 01/22/21  [provider]   promethazine-dextromethorphan (PROMETHAZINE-DM) 6.25-15 MG/5ML syrup Take 5 mLs by mouth 4 (four) times daily as needed for cough. 11/21/21   Sereena Marando, Leitha Schuller, NP  dicyclomine (BENTYL) 10 MG capsule Take by mouth. 07/28/20 03/08/21  [provider]  escitalopram (LEXAPRO) 5 MG tablet Take 5 mg by mouth daily. 07/29/20 03/08/21  [provider]  sucralfate (CARAFATE) 1 GM/10ML suspension Take by mouth. 07/05/20 03/08/21  [provider]    Family History Family History  Problem Relation Age of Onset   Healthy Mother    Healthy Father     Social History Social History   Tobacco Use   Smoking status: Never   Smokeless tobacco: Never  Vaping Use   Vaping Use: Never used  Substance Use Topics   Alcohol use: Never   Drug use: Never     Allergies   Patient has no known allergies.   Review of Systems Review of Systems  Constitutional: Negative.   HENT:  Positive for congestion, postnasal drip, rhinorrhea and sore throat. Negative for dental problem, drooling, ear discharge, ear pain, facial swelling, hearing loss, mouth sores, nosebleeds, sinus pressure, sinus pain, sneezing, tinnitus, trouble swallowing and voice change.   Respiratory: Negative.    Cardiovascular: Negative.   Gastrointestinal: Negative.   Skin: Negative.   Neurological: Negative.     Physical Exam Triage Vital Signs ED Triage  Vitals  Enc Vitals Group     BP 01/08/22 0814 113/76     Pulse Rate 01/08/22 0814 73     Resp 01/08/22 0814 18     Temp 01/08/22 0814 98 F (36.7 C)     Temp Source 01/08/22 0814 Oral     SpO2 01/08/22 0814 100 %     Weight 01/08/22 0812 165 lb (74.8 kg)     Height 01/08/22 0812 5\' 5"  (1.651 m)     Head Circumference --      Peak Flow --      Pain Score 01/08/22 0812 8     Pain Loc --      Pain Edu? --      Excl. in [place]? --    No data found.  Updated Vital Signs BP 113/76 (BP Location: Left Arm)    Pulse 73    Temp 98 F (36.7 C) (Oral)    Resp 18     Ht 5\' 5"  (1.651 m)    Wt 165 lb (74.8 kg)    LMP 01/03/2022 (Approximate)    SpO2 100%    BMI 27.46 kg/m   Visual Acuity Right Eye Distance:   Left Eye Distance:   Bilateral Distance:    Right Eye Near:   Left Eye Near:    Bilateral Near:     Physical Exam Constitutional:      Appearance: Normal appearance. She is well-developed.  HENT:     Head: Normocephalic.     Right Ear: Tympanic membrane, ear canal and external ear normal.     Left Ear: Tympanic membrane, ear canal and external ear normal.     Nose: Congestion present. No rhinorrhea.     Mouth/Throat:     Mouth: Mucous membranes are moist.     Pharynx: Oropharynx is clear.  Eyes:     Extraocular Movements: Extraocular movements intact.  Cardiovascular:     Rate and Rhythm: Normal rate and regular rhythm.     Pulses: Normal pulses.     Heart sounds: Normal heart sounds.  Pulmonary:     Effort: Pulmonary effort is normal.     Breath sounds: Normal breath sounds.  Musculoskeletal:        General: Normal range of motion.     Cervical back: Normal range of motion and neck supple.  Skin:    General: Skin is warm and dry.  Neurological:     Mental Status: She is alert and oriented to person, place, and time. Mental status is at baseline.  Psychiatric:        Mood and Affect: Mood normal.        Behavior: Behavior normal.     UC Treatments / Results  Labs (all labs ordered are listed, but only abnormal results are displayed) Labs Reviewed - No data to display  EKG   Radiology No results found.  Procedures Procedures (including critical care time)  Medications Ordered in UC Medications - No data to display  Initial Impression / Assessment and Plan / UC Course  I have reviewed the triage vital signs and the nursing notes.  Pertinent labs & imaging results that were available during my care of the patient were reviewed by me and considered in my medical decision making (see chart for details).  Viral  URI  Discussed etiology of symptoms, timeline and possible resolution with patient and parent, encourage patient and parent to be patient as symptoms will steadily resolve with time, strep,  flu and COVID test negative, discussed findings, prescribed viscous lidocaine and ibuprofen 800 mg for management of sore throat, recommended salt water gargles, throat lozenges, warm tea, honey in addition for comfort, may use over-the-counter decongestions, antihistamines and nasal sprays for management of congestion, urgent care follow-up as needed Final Clinical Impressions(s) / UC Diagnoses   Final diagnoses:  None   Discharge Instructions   None    ED Prescriptions   None    PDMP not reviewed this encounter.   Hans Eden, [place] 01/08/22 [phone]

## 2022-01-08 NOTE — Discharge Instructions (Addendum)
Your strep test today was negative Your flu and COVID test today were negative  Your symptoms today are most likely being caused by a virus and should steadily improve with time, your sore throat is most likely being worsened by your congestion    You can take Tylenol and/or Ibuprofen as needed for fever reduction and pain relief.   For cough: honey 1/2 to 1 teaspoon (you can dilute the honey in water or another fluid).  You can also use guaifenesin and dextromethorphan for cough. You can use a humidifier for chest congestion and cough.  If you don't have a humidifier, you can sit in the bathroom with the hot shower running.      For sore throat: try warm salt water gargles, cepacol lozenges, throat spray, warm tea or water with lemon/honey, popsicles or ice, or OTC cold relief medicine for throat discomfort.   For congestion: take a daily anti-histamine like Zyrtec, Claritin, and a oral decongestant, such as pseudoephedrine.  You can also use Flonase 1-2 sprays in each nostril daily.   It is important to stay hydrated: drink plenty of fluids (water, gatorade/powerade/pedialyte, juices, or teas) to keep your throat moisturized and help further relieve irritation/discomfort.

## 2022-01-08 NOTE — ED Triage Notes (Signed)
Patient is here for "s/t, congestion". Congestion has been "a while". S/t started "last last night, this am". No fever. Was runny, now just congested. No cough. No sob. No new/unexplained rash. No exposure to COVID19 known and/or recent testing (does work at a daycare). COVID19 vaccines "none". Flu vaccine "yes".

## 2022-02-02 ENCOUNTER — Other Ambulatory Visit: Payer: Self-pay

## 2022-02-02 ENCOUNTER — Ambulatory Visit
Admission: RE | Admit: 2022-02-02 | Discharge: 2022-02-02 | Disposition: A | Discharge status: Home / Self Care | Payer: Managed Care, Other (non HMO) | Source: Ambulatory Visit | Attending: Emergency Medicine | Admitting: Emergency Medicine

## 2022-02-02 VITALS — BP 118/76 | HR 95 | Temp 99.7°F | Resp 18 | Ht 65.0 in | Wt 160.0 lb

## 2022-02-02 DIAGNOSIS — U071 COVID-19: Secondary | ICD-10-CM | POA: Insufficient documentation

## 2022-02-02 LAB — RESP PANEL BY RT-PCR (FLU A&B, COVID) ARPGX2
Influenza A by PCR: NEGATIVE
Influenza B by PCR: NEGATIVE
SARS Coronavirus 2 by RT PCR: POSITIVE — AB

## 2022-02-02 MED ORDER — LIDOCAINE VISCOUS HCL 2 % MT SOLN: 15.0000 mL | OROMUCOSAL | 0 refills | Status: DC | PRN

## 2022-02-02 MED ORDER — PROMETHAZINE-DM 6.25-15 MG/5ML PO SYRP: 5.0000 mL | ORAL_SOLUTION | Freq: Four times a day (QID) | ORAL | 0 refills | Status: DC | PRN

## 2022-02-02 MED ORDER — BENZONATATE 100 MG PO CAPS: 100.0000 mg | ORAL_CAPSULE | Freq: Three times a day (TID) | ORAL | 0 refills | Status: DC

## 2022-02-02 NOTE — ED Triage Notes (Signed)
Pt c/o cough, sore throat, congestion, temperature of 104 x3days.  Pt would like covid and flu testing.

## 2022-02-02 NOTE — ED Provider Notes (Signed)
[facility]    CSN: [id number] Arrival date & time: 02/02/22  1705      History   Chief Complaint Chief Complaint  Patient presents with   Sore Throat   Cough    Appt @ 5    HPI Marissa Harrington is a 21 y.o. female.   Patient presents with chills, fever, nasal congestion, bilateral ear pain, rhinorrhea, sore throat and nonproductive cough for 3 days.  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  Decreased appetite but tolerating fluids.  Has attempted use of over-the-counter Mucinex cold and flu and Tylenol which has been somewhat helpful.  Denies chest pain or tightness, shortness of breath, wheezing, abdominal pain, nausea, vomiting, diarrhea, headache  Past Medical History:  Diagnosis Date   Acid reflux    Anxiety     There are no problems to display for this patient.   History reviewed. No pertinent surgical history.  OB History   No obstetric history on file.      Home Medications    Prior to Admission medications   Medication Sig Start Date End Date Taking? Authorizing Provider  benzonatate (TESSALON) 100 MG capsule Take 1 capsule (100 mg total) by mouth every 8 (eight) hours. 11/21/21   Valinda Hoar, NP  cetirizine (ZYRTEC ALLERGY) 10 MG tablet Take 1 tablet (10 mg total) by mouth daily. 11/21/21   Chen Holzman, Elita Boone, NP  ibuprofen (ADVIL) 800 MG tablet Take 1 tablet (800 mg total) by mouth 3 (three) times daily. 01/08/22   Rita Prom, Elita Boone, NP  ipratropium (ATROVENT) 0.06 % nasal spray Place 2 sprays into both nostrils 4 (four) times daily for 15 days. 05/03/21 05/18/21  Eusebio Friendly B, PA-C  lidocaine (XYLOCAINE) 2 % solution Use as directed 15 mLs in the mouth or throat as needed for mouth pain. 01/08/22   Braylon Lemmons, Elita Boone, NP  montelukast (SINGULAIR) 10 MG tablet Take 1 tablet (10 mg total) by mouth at bedtime. 05/03/21 06/02/21  Eusebio Friendly B, PA-C  pantoprazole (PROTONIX) 40 MG tablet Take by mouth. 07/26/20 01/22/21  [provider]  promethazine-dextromethorphan (PROMETHAZINE-DM) 6.25-15 MG/5ML syrup Take 5 mLs by mouth 4 (four) times daily as needed for cough. 11/21/21   Shaton Lore, Elita Boone, NP  dicyclomine (BENTYL) 10 MG capsule Take by mouth. 07/28/20 03/08/21  [provider]  escitalopram (LEXAPRO) 5 MG tablet Take 5 mg by mouth daily. 07/29/20 03/08/21  [provider]  sucralfate (CARAFATE) 1 GM/10ML suspension Take by mouth. 07/05/20 03/08/21  [provider]    Family History Family History  Problem Relation Age of Onset   Healthy Mother    Healthy Father     Social History Social History   Tobacco Use   Smoking status: Never   Smokeless tobacco: Never  Vaping Use   Vaping Use: Never used  Substance Use Topics   Alcohol use: Never   Drug use: Never     Allergies   Patient has no known allergies.   Review of Systems Review of Systems  Constitutional:  Positive for chills and fever. Negative for activity change, appetite change, diaphoresis, fatigue and unexpected weight change.  HENT:  Positive for congestion, ear pain, rhinorrhea and sore throat. Negative for dental problem, drooling, ear discharge, facial swelling, hearing loss, mouth sores, nosebleeds, postnasal drip, sinus pressure, sinus pain, sneezing, tinnitus, trouble swallowing and voice change.   Respiratory:  Positive for cough. Negative for apnea, choking, chest tightness, shortness of breath,  wheezing and stridor.   Cardiovascular: Negative.   Gastrointestinal: Negative.   Skin: Negative.   Neurological: Negative.     Physical Exam Triage Vital Signs ED Triage Vitals  Enc Vitals Group     BP 02/02/22 1716 118/76     Pulse Rate 02/02/22 1716 95     Resp 02/02/22 1716 18     Temp 02/02/22 1716 99.7 F (37.6 C)     Temp Source 02/02/22 1716 Oral     SpO2 02/02/22 1716 100 %     Weight 02/02/22 1714 160 lb (72.6 kg)     Height 02/02/22 1714 5\' 5"  (1.651 m)     Head Circumference --       Peak Flow --      Pain Score 02/02/22 1714 0     Pain Loc --      Pain Edu? --      Excl. in [place]? --    No data found.  Updated Vital Signs BP 118/76 (BP Location: Left Arm)    Pulse 95    Temp 99.7 F (37.6 C) (Oral)    Resp 18    Ht 5\' 5"  (1.651 m)    Wt 160 lb (72.6 kg)    LMP 01/03/2022 (Approximate)    SpO2 100%    BMI 26.63 kg/m   Visual Acuity Right Eye Distance:   Left Eye Distance:   Bilateral Distance:    Right Eye Near:   Left Eye Near:    Bilateral Near:     Physical Exam Constitutional:      Appearance: Normal appearance.  HENT:     Head: Normocephalic.     Right Ear: Tympanic membrane, ear canal and external ear normal.     Left Ear: Tympanic membrane, ear canal and external ear normal.     Nose: Congestion and rhinorrhea present.     Mouth/Throat:     Mouth: Mucous membranes are moist.     Pharynx: Oropharynx is clear.  Eyes:     Extraocular Movements: Extraocular movements intact.  Cardiovascular:     Rate and Rhythm: Normal rate and regular rhythm.     Pulses: Normal pulses.     Heart sounds: Normal heart sounds.  Pulmonary:     Effort: Pulmonary effort is normal.     Breath sounds: Normal breath sounds.  Musculoskeletal:     Cervical back: Normal range of motion and neck supple.  Skin:    General: Skin is warm and dry.  Neurological:     Mental Status: She is alert and oriented to person, place, and time. Mental status is at baseline.  Psychiatric:        Mood and Affect: Mood normal.        Behavior: Behavior normal.     UC Treatments / Results  Labs (all labs ordered are listed, but only abnormal results are displayed) Labs Reviewed  RESP PANEL BY RT-PCR (FLU A&B, COVID) ARPGX2 - Abnormal; Notable for the following components:      Result Value   SARS Coronavirus 2 by RT PCR POSITIVE (*)    All other components within normal limits    EKG   Radiology No results found.  Procedures Procedures (including critical care  time)  Medications Ordered in UC Medications - No data to display  Initial Impression / Assessment and Plan / UC Course  I have reviewed the triage vital signs and the nursing notes.  Pertinent labs & imaging results that  were available during my care of the patient were reviewed by me and considered in my medical decision making (see chart for details).  COVID-19  Confirmed by PCR, flu negative, discussed findings with patient.,  Vital signs are stable with a low-grade fever of 99.7, O2 saturation 100% and lungs are clear to auscultation, discussed antiviral treatment to help patient does not qualify as she is a young healthy adult,, patient endorses that she can continue use over-the-counter medications for symptom management, prescribed Tessalon, Promethazine DM and lidocaine viscous in case patient's symptoms are to worsen and she requires prescription medication, work note given, may follow-up with urgent care as needed Final Clinical Impressions(s) / UC Diagnoses   Final diagnoses:  None   Discharge Instructions   None    ED Prescriptions   None    PDMP not reviewed this encounter.   Valinda Hoar, NP 02/02/22 1920

## 2022-02-02 NOTE — Discharge Instructions (Signed)
We will contact you if your COVID test is positive.  Please quarantine while you wait for the results.  If your test is negative you may resume normal activities.  If your test is positive please continue to quarantine for at least 5 days from your symptom onset or until you are without a fever for at least 24 hours after the medications.    You can take Tylenol and/or Ibuprofen as needed for fever reduction and pain relief.   For cough: honey 1/2 to 1 teaspoon (you can dilute the honey in water or another fluid).  You can also use guaifenesin and dextromethorphan for cough. You can use a humidifier for chest congestion and cough.  If you don't have a humidifier, you can sit in the bathroom with the hot shower running.      For sore throat: try warm salt water gargles, cepacol lozenges, throat spray, warm tea or water with lemon/honey, popsicles or ice, or OTC cold relief medicine for throat discomfort.   For congestion: take a daily anti-histamine like Zyrtec, Claritin, and a oral decongestant, such as pseudoephedrine.  You can also use Flonase 1-2 sprays in each nostril daily.   It is important to stay hydrated: drink plenty of fluids (water, gatorade/powerade/pedialyte, juices, or teas) to keep your throat moisturized and help further relieve irritation/discomfort.   

## 2022-07-18 IMAGING — CR DG CHEST 2V
2 series · 2 of 2 positions shown · non-contrast
Comparison: 07/31/2020

CLINICAL DATA: Chest pain on breathing.

EXAM:
CHEST - 2 VIEW

[chest pa]
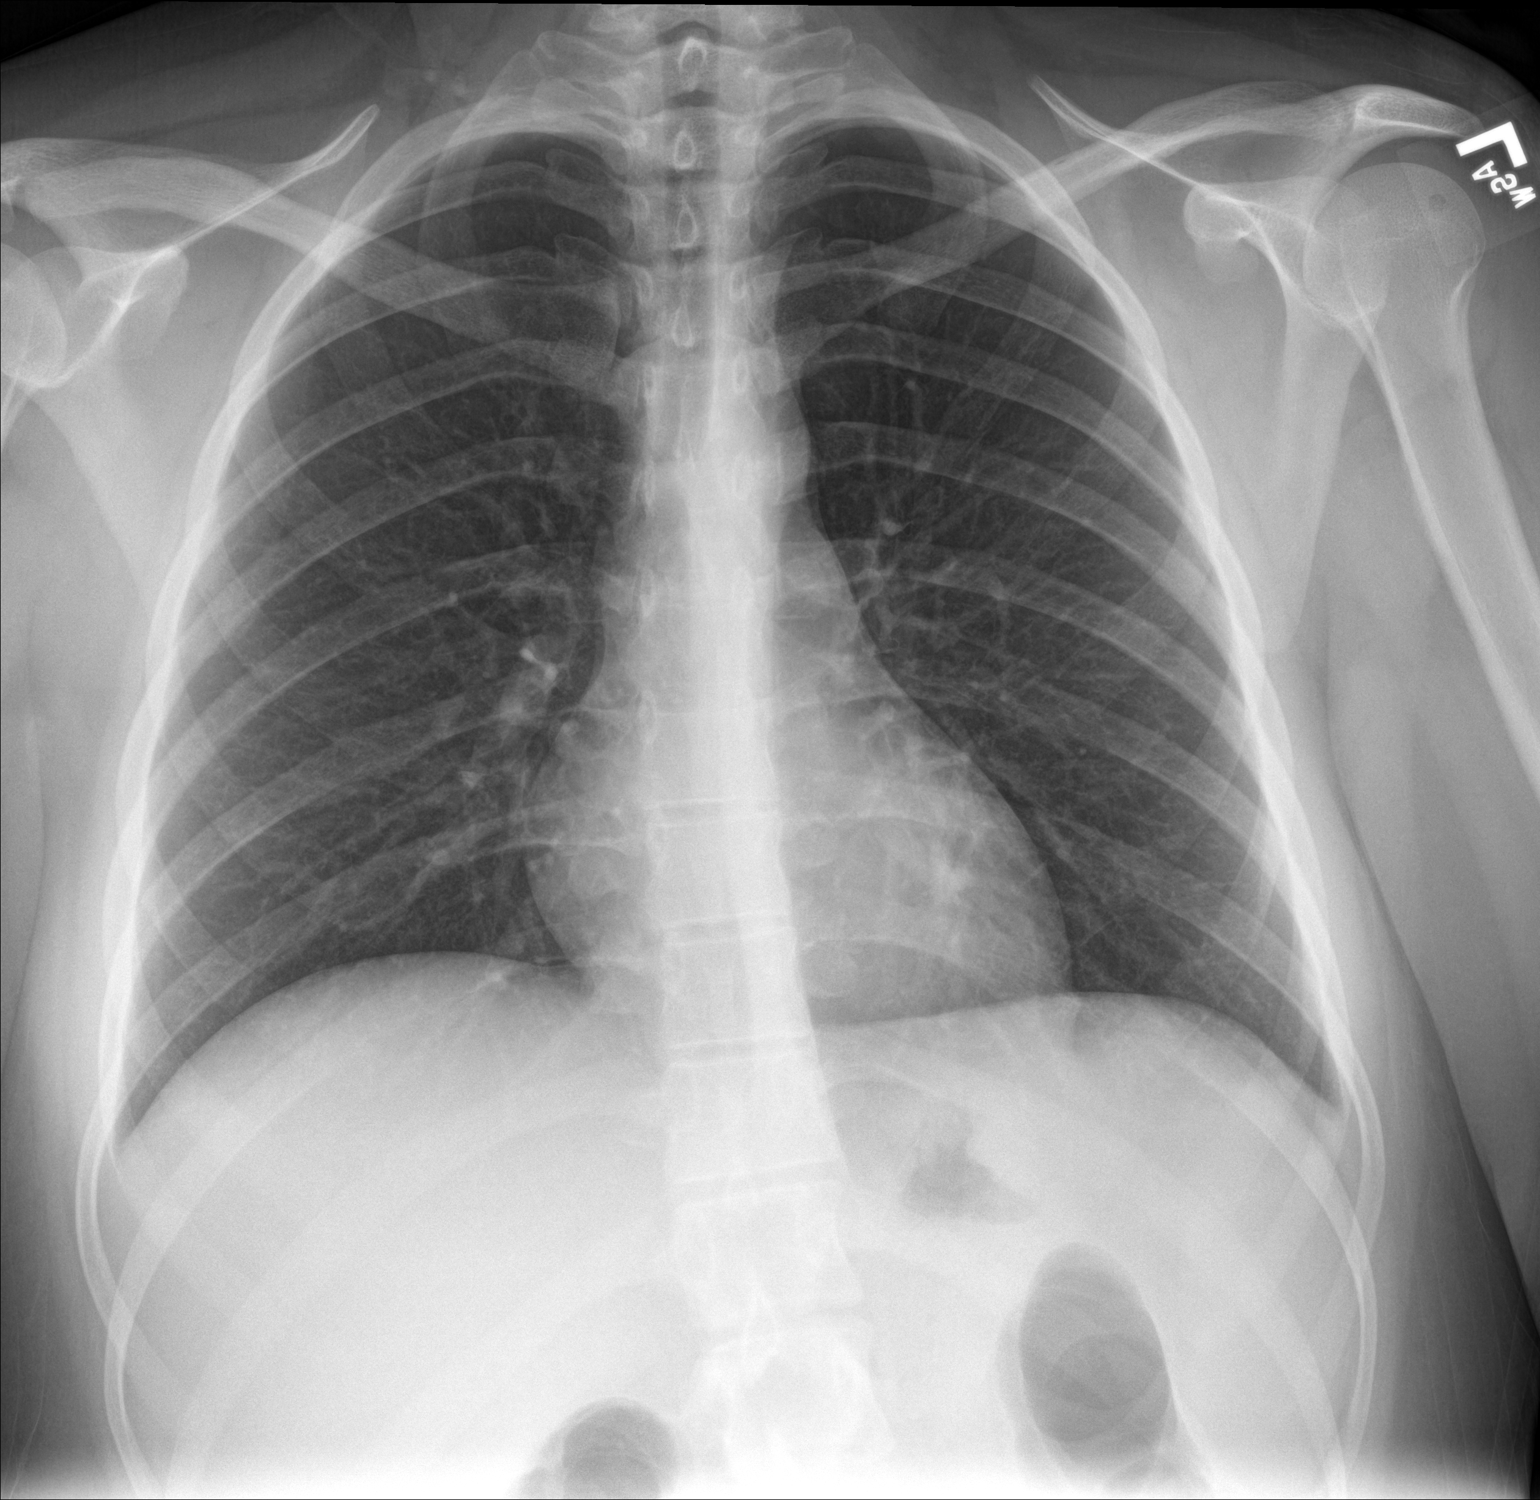

[chest lat]
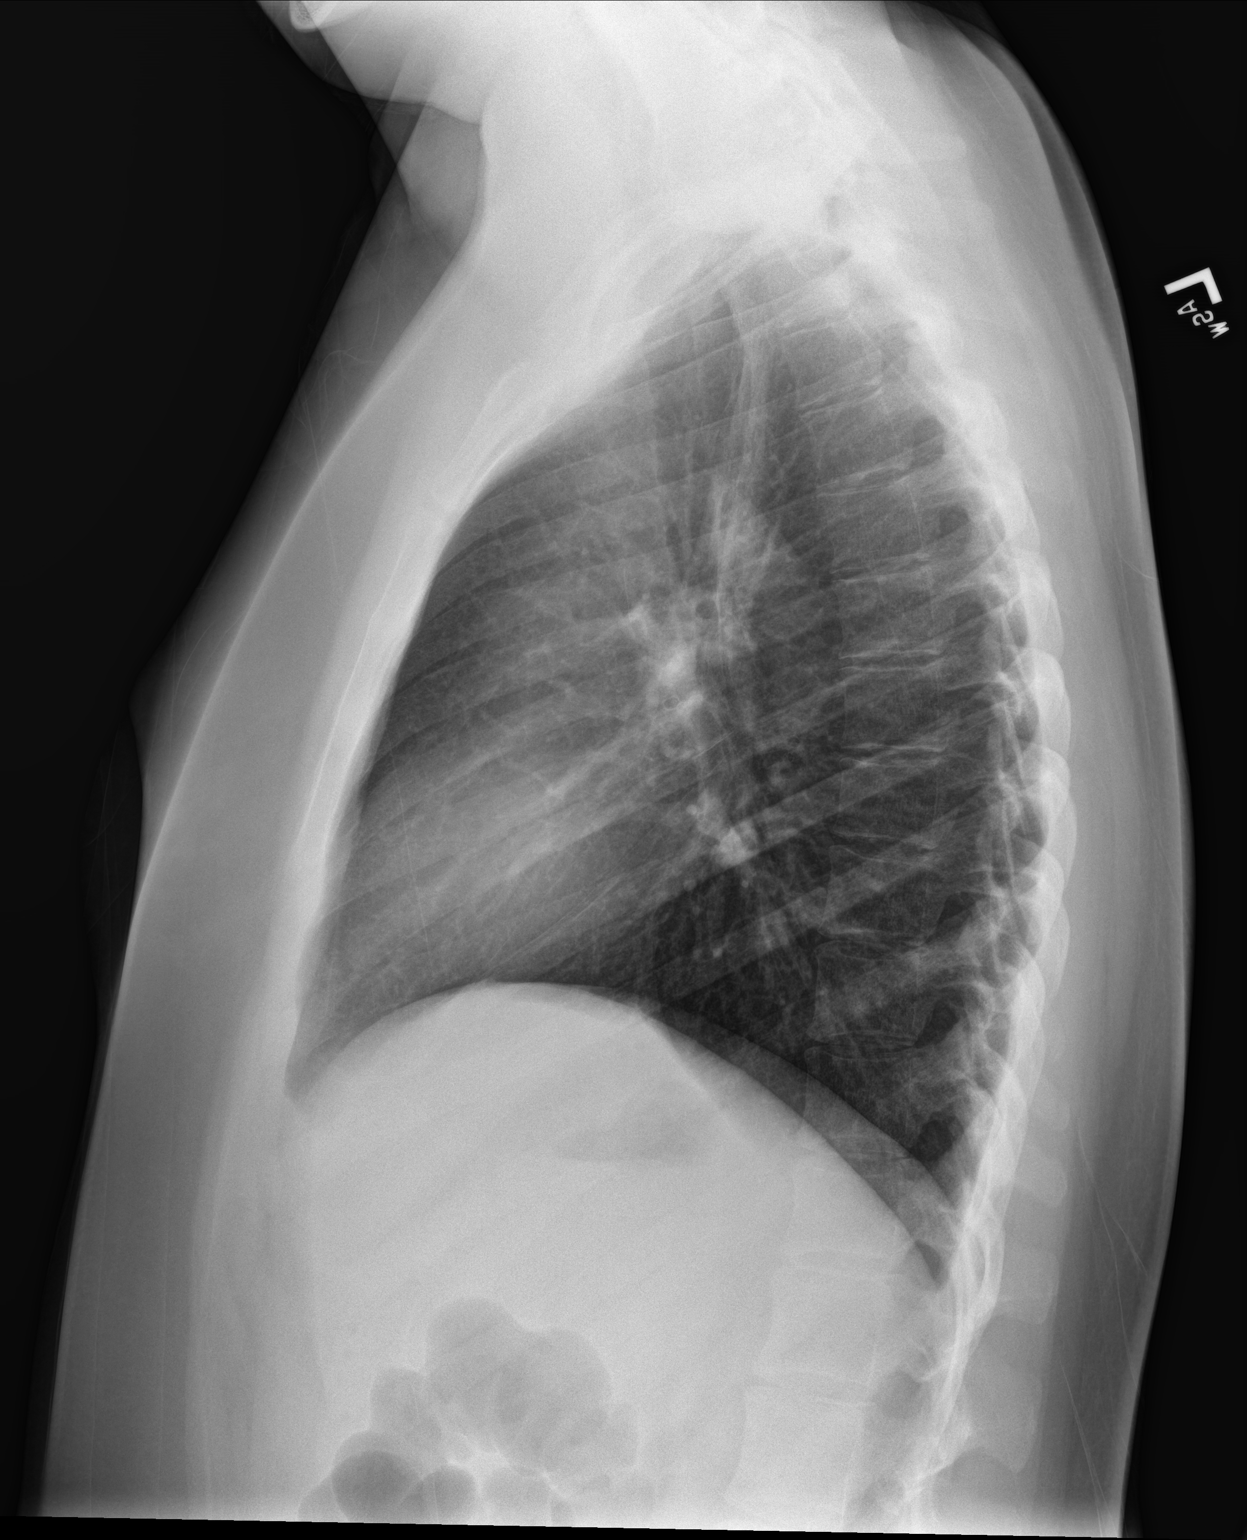

[2 of 2 positions shown; findings below may reference images not displayed]

FINDINGS: The cardiomediastinal contours are normal. The lungs are clear.
Pulmonary vasculature is normal. No consolidation, pleural effusion,
or pneumothorax. No acute osseous abnormalities are seen.
IMPRESSION: Negative radiographs of the chest.

## 2022-09-21 ENCOUNTER — Ambulatory Visit: Payer: Self-pay

## 2022-10-31 ENCOUNTER — Ambulatory Visit
Admission: EM | Admit: 2022-10-31 | Discharge: 2022-10-31 | Disposition: A | Discharge status: Home / Self Care | Payer: Managed Care, Other (non HMO) | Attending: Physician Assistant | Admitting: Physician Assistant

## 2022-10-31 DIAGNOSIS — J029 Acute pharyngitis, unspecified: Secondary | ICD-10-CM | POA: Diagnosis present

## 2022-10-31 DIAGNOSIS — R509 Fever, unspecified: Secondary | ICD-10-CM | POA: Insufficient documentation

## 2022-10-31 DIAGNOSIS — J020 Streptococcal pharyngitis: Secondary | ICD-10-CM | POA: Diagnosis present

## 2022-10-31 LAB — GROUP A STREP BY PCR: Group A Strep by PCR: DETECTED — AB

## 2022-10-31 MED ORDER — AMOXICILLIN 500 MG PO CAPS
500.0000 mg | ORAL_CAPSULE | Freq: Two times a day (BID) | ORAL | 0 refills | Status: AC
Start: 2022-10-31 — End: 2022-11-10

## 2022-10-31 NOTE — ED Triage Notes (Signed)
Pt c/o sore throat, body chills, temperature of 101.3 x2days  Pt last had tylenol for fever last night.   Pt asks for a strep test to be done.

## 2022-10-31 NOTE — ED Provider Notes (Signed)
[facility]    CSN: [id number] Arrival date & time: 10/31/22  0800      History   Chief Complaint Chief Complaint  Patient presents with   Sore Throat    HPI Marissa Harrington is a 21 y.o. female presenting for onset of fever up to 101.3 degrees, fatigue, chills, body aches and sore throat last night.  Denies cough or congestion.  No report of chest pain or breathing difficulty, abdominal pain, vomiting or diarrhea.  Denies any sick contacts.  Has taken Alka-Seltzer and Tylenol for symptoms.  No other complaints.  HPI  Past Medical History:  Diagnosis Date   Acid reflux    Anxiety     There are no problems to display for this patient.   History reviewed. No pertinent surgical history.  OB History   No obstetric history on file.      Home Medications    Prior to Admission medications   Medication Sig Start Date End Date Taking? Authorizing Provider  amoxicillin (AMOXIL) 500 MG capsule Take 1 capsule (500 mg total) by mouth 2 (two) times daily for 10 days. 10/31/22 11/10/22 Yes Danton Clap, PA-C  benzonatate (TESSALON) 100 MG capsule Take 1 capsule (100 mg total) by mouth every 8 (eight) hours. 02/02/22   Hans Eden, NP  cetirizine (ZYRTEC ALLERGY) 10 MG tablet Take 1 tablet (10 mg total) by mouth daily. 11/21/21   White, Leitha Schuller, NP  ibuprofen (ADVIL) 800 MG tablet Take 1 tablet (800 mg total) by mouth 3 (three) times daily. 01/08/22   White, Leitha Schuller, NP  ipratropium (ATROVENT) 0.06 % nasal spray Place 2 sprays into both nostrils 4 (four) times daily for 15 days. 05/03/21 05/18/21  Laurene Footman B, PA-C  lidocaine (XYLOCAINE) 2 % solution Use as directed 15 mLs in the mouth or throat as needed for mouth pain. 02/02/22   White, Adrienne R, NP  montelukast (SINGULAIR) 10 MG tablet Take 1 tablet (10 mg total) by mouth at bedtime. 05/03/21 06/02/21  Laurene Footman B, PA-C  pantoprazole (PROTONIX) 40 MG tablet Take by mouth. 07/26/20 01/22/21  [provider]  promethazine-dextromethorphan (PROMETHAZINE-DM) 6.25-15 MG/5ML syrup Take 5 mLs by mouth 4 (four) times daily as needed for cough. 02/02/22   White, Leitha Schuller, NP  dicyclomine (BENTYL) 10 MG capsule Take by mouth. 07/28/20 03/08/21  [provider]  escitalopram (LEXAPRO) 5 MG tablet Take 5 mg by mouth daily. 07/29/20 03/08/21  [provider]  sucralfate (CARAFATE) 1 GM/10ML suspension Take by mouth. 07/05/20 03/08/21  [provider]    Family History Family History  Problem Relation Age of Onset   Healthy Mother    Healthy Father     Social History Social History   Tobacco Use   Smoking status: Never   Smokeless tobacco: Never  Vaping Use   Vaping Use: Never used  Substance Use Topics   Alcohol use: Never   Drug use: Never     Allergies   Patient has no known allergies.   Review of Systems Review of Systems  Constitutional:  Positive for chills, fatigue and fever. Negative for diaphoresis.  HENT:  Positive for sore throat. Negative for congestion, ear pain, rhinorrhea, sinus pressure and sinus pain.   Respiratory:  Negative for cough and shortness of breath.   Gastrointestinal:  Negative for abdominal pain, nausea and vomiting.  Musculoskeletal:  Positive for myalgias. Negative for arthralgias.  Skin:  Negative for rash.  Neurological:  Negative for weakness and headaches.  Hematological:  Negative for adenopathy.     Physical Exam Triage Vital Signs ED Triage Vitals  Enc Vitals Group     BP      Pulse      Resp      Temp      Temp src      SpO2      Weight      Height      Head Circumference      Peak Flow      Pain Score      Pain Loc      Pain Edu?      Excl. in [place]?    No data found.  Updated Vital Signs BP (!) 103/59 (BP Location: Left Arm)   Pulse (!) 121   Temp 100.2 F (37.9 C) (Oral)   Resp 18   Ht 5\' 5"  (1.651 m)   Wt 155 lb (70.3 kg)   LMP 10/17/2022   SpO2 97%   BMI 25.79 kg/m      Physical  Exam Vitals and nursing note reviewed.  Constitutional:      General: She is not in acute distress.    Appearance: Normal appearance. She is well-developed. She is ill-appearing. She is not toxic-appearing.  HENT:     Head: Normocephalic and atraumatic.     Nose: Nose normal.     Mouth/Throat:     Mouth: Mucous membranes are moist.     Pharynx: Oropharynx is clear. Posterior oropharyngeal erythema present.     Tonsils: 1+ on the right. 1+ on the left.  Eyes:     General: No scleral icterus.       Right eye: No discharge.        Left eye: No discharge.     Conjunctiva/sclera: Conjunctivae normal.  Cardiovascular:     Rate and Rhythm: Regular rhythm. Tachycardia present.     Heart sounds: Normal heart sounds.  Pulmonary:     Effort: Pulmonary effort is normal. No respiratory distress.     Breath sounds: Normal breath sounds.  Musculoskeletal:     Cervical back: Neck supple.  Skin:    General: Skin is dry.  Neurological:     General: No focal deficit present.     Mental Status: She is alert. Mental status is at baseline.     Motor: No weakness.     Gait: Gait normal.  Psychiatric:        Mood and Affect: Mood normal.        Behavior: Behavior normal.        Thought Content: Thought content normal.      UC Treatments / Results  Labs (all labs ordered are listed, but only abnormal results are displayed) Labs Reviewed  GROUP A STREP BY PCR - Abnormal; Notable for the following components:      Result Value   Group A Strep by PCR DETECTED (*)    All other components within normal limits    EKG   Radiology No results found.  Procedures Procedures (including critical care time)  Medications Ordered in UC Medications - No data to display  Initial Impression / Assessment and Plan / UC Course  I have reviewed the triage vital signs and the nursing notes.  Pertinent labs & imaging results that were available during my care of the patient were reviewed by me and  considered in my medical decision making (see chart for details).  21 year old female presenting for fever, chills, body aches, fatigue and sore throat that started yesterday.  Pulse elevated at 121 bpm.  Temp is 100.2 degrees.  Other vitals stable.  She is ill-appearing but nontoxic.  On exam she does have erythema of posterior pharynx with 1+ bilateral enlarged tonsils.  Chest clear auscultation and heart regular rhythm.  PCR strep test performed.  Positive.  Discussed results patient.  Treating with amoxicillin.  Supportive care advised.  Reviewed return and ER precautions.  Work note given.   Final Clinical Impressions(s) / UC Diagnoses   Final diagnoses:  Strep pharyngitis  Sore throat  Fever, unspecified     Discharge Instructions      -Your strep test was positive.  I sent antibiotics to pharmacy and you should be feeling better in couple days.  Continue to increase rest and fluids and take ibuprofen and Tylenol as needed for pain and fever control. - Return if symptoms or not improving in the next couple of days or if they worsen.     ED Prescriptions     Medication Sig Dispense Auth. Provider   amoxicillin (AMOXIL) 500 MG capsule Take 1 capsule (500 mg total) by mouth 2 (two) times daily for 10 days. 20 capsule Shirlee Latch, PA-C      PDMP not reviewed this encounter.   Shirlee Latch, PA-C 10/31/22 [phone]

## 2022-10-31 NOTE — Discharge Instructions (Signed)
-  Your strep test was positive.  I sent antibiotics to pharmacy and you should be feeling better in couple days.  Continue to increase rest and fluids and take ibuprofen and Tylenol as needed for pain and fever control. - Return if symptoms or not improving in the next couple of days or if they worsen.

## 2022-11-01 ENCOUNTER — Ambulatory Visit
Admission: EM | Admit: 2022-11-01 | Discharge status: Against Medical Advice | Payer: Managed Care, Other (non HMO) | Source: Home / Self Care

## 2022-11-26 ENCOUNTER — Ambulatory Visit: Payer: Self-pay

## 2023-01-21 IMAGING — CR DG CHEST 2V
2 series · 2 of 2 positions shown · non-contrast
Comparison: 10/28/2020

CLINICAL DATA: Cough congestion

EXAM:
CHEST - 2 VIEW

[chest pa]
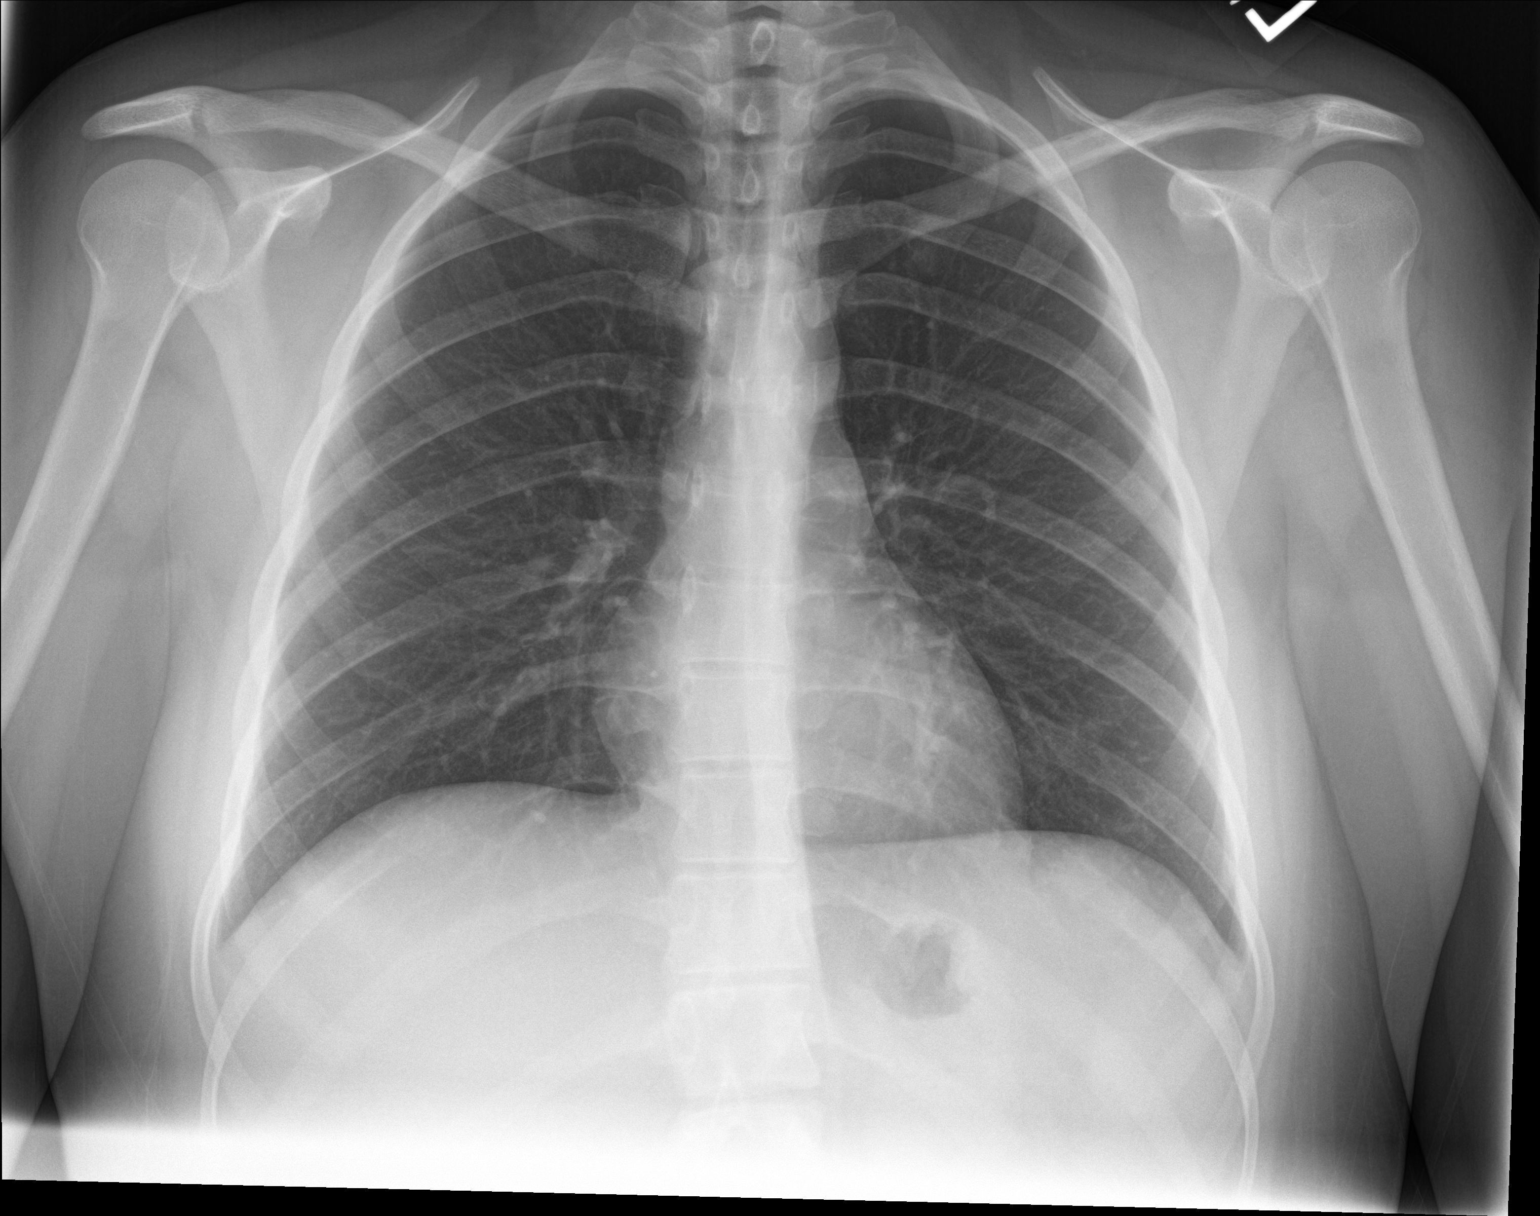

[chest lat]
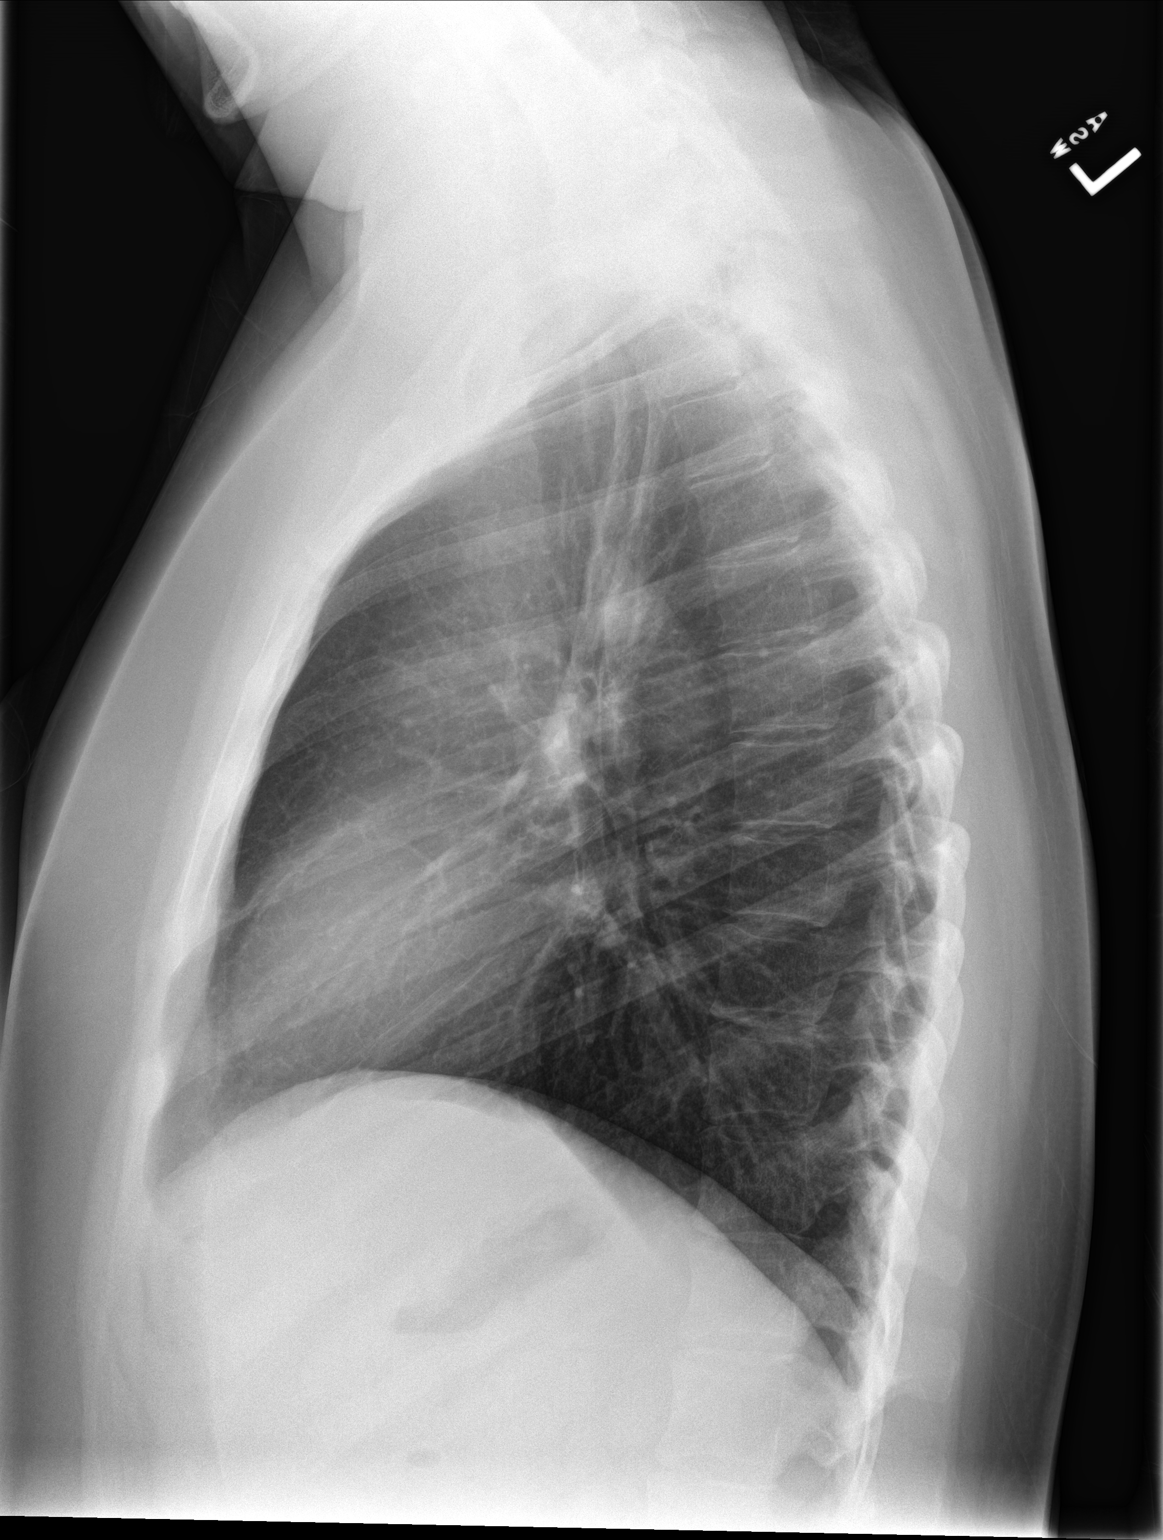

[2 of 2 positions shown; findings below may reference images not displayed]

FINDINGS: The heart size and mediastinal contours are within normal limits.
Both lungs are clear. The visualized skeletal structures are
unremarkable.
IMPRESSION: No active cardiopulmonary disease.

## 2023-01-31 ENCOUNTER — Ambulatory Visit
Admission: EM | Admit: 2023-01-31 | Discharge: 2023-01-31 | Disposition: A | Discharge status: Home / Self Care | Payer: Managed Care, Other (non HMO) | Attending: Internal Medicine | Admitting: Internal Medicine

## 2023-01-31 DIAGNOSIS — J069 Acute upper respiratory infection, unspecified: Secondary | ICD-10-CM | POA: Insufficient documentation

## 2023-01-31 DIAGNOSIS — J029 Acute pharyngitis, unspecified: Secondary | ICD-10-CM | POA: Insufficient documentation

## 2023-01-31 DIAGNOSIS — R0981 Nasal congestion: Secondary | ICD-10-CM | POA: Diagnosis not present

## 2023-01-31 LAB — GROUP A STREP BY PCR: Group A Strep by PCR: NOT DETECTED

## 2023-01-31 MED ORDER — PSEUDOEPH-BROMPHEN-DM 30-2-10 MG/5ML PO SYRP: 10.0000 mL | ORAL_SOLUTION | Freq: Four times a day (QID) | ORAL | 0 refills | Status: AC | PRN

## 2023-01-31 MED ORDER — IPRATROPIUM BROMIDE 0.06 % NA SOLN: 2.0000 | Freq: Four times a day (QID) | NASAL | 0 refills | Status: DC

## 2023-01-31 NOTE — Discharge Instructions (Signed)
URI/COLD SYMPTOMS: Negative strep test. Your exam today is consistent with a viral illness. Antibiotics are not indicated at this time. Use medications as directed, including cough syrup, nasal saline, and decongestants. Your symptoms should improve over the next few days and resolve within 7-10 days. Increase rest and fluids. F/u if symptoms worsen or predominate such as sore throat, ear pain, productive cough, shortness of breath, or if you develop high fevers or worsening fatigue over the next several days.   ?

## 2023-01-31 NOTE — ED Provider Notes (Signed)
[facility]    CSN: [id number] Arrival date & time: 01/31/23  0801      History   Chief Complaint Chief Complaint  Patient presents with   Sore Throat    HPI Marissa Harrington is a 22 y.o. female presenting for onset of headaches, fatigue, nasal congestion, and sore throat 4 days ago.  Denies cough or fever. No report of chest pain or breathing difficulty, abdominal pain, vomiting or diarrhea.  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 No known exposure to COVID or flu. Has taken Mucinex and Afrin for symptoms without relief.  No other complaints.  HPI  Past Medical History:  Diagnosis Date   Acid reflux    Anxiety     There are no problems to display for this patient.   History reviewed. No pertinent surgical history.  OB History   No obstetric history on file.      Home Medications    Prior to Admission medications   Medication Sig Start Date End Date Taking? Authorizing Provider  brompheniramine-pseudoephedrine-DM 30-2-10 MG/5ML syrup Take 10 mLs by mouth 4 (four) times daily as needed for up to 7 days. 01/31/23 02/07/23 Yes Laurene Footman B, PA-C  ipratropium (ATROVENT) 0.06 % nasal spray Place 2 sprays into both nostrils 4 (four) times daily. 01/31/23  Yes Laurene Footman B, PA-C  montelukast (SINGULAIR) 10 MG tablet Take 1 tablet (10 mg total) by mouth at bedtime. 05/03/21 06/02/21  Laurene Footman B, PA-C  pantoprazole (PROTONIX) 40 MG tablet Take by mouth. 07/26/20 01/22/21  [provider]  dicyclomine (BENTYL) 10 MG capsule Take by mouth. 07/28/20 03/08/21  [provider]  escitalopram (LEXAPRO) 5 MG tablet Take 5 mg by mouth daily. 07/29/20 03/08/21  [provider]  sucralfate (CARAFATE) 1 GM/10ML suspension Take by mouth. 07/05/20 03/08/21  [provider]    Family History Family History  Problem Relation Age of Onset   Healthy Mother    Healthy Father     Social History Social History    Tobacco Use   Smoking status: Never   Smokeless tobacco: Never  Vaping Use   Vaping Use: Never used  Substance Use Topics   Alcohol use: Never   Drug use: Never     Allergies   Patient has no known allergies.   Review of Systems Review of Systems  Constitutional:  Positive for fatigue. Negative for chills, diaphoresis and fever.  HENT:  Positive for congestion, rhinorrhea and sore throat. Negative for ear pain, sinus pressure and sinus pain.   Respiratory:  Negative for cough and shortness of breath.   Gastrointestinal:  Negative for abdominal pain, nausea and vomiting.  Musculoskeletal:  Negative for arthralgias and myalgias.  Skin:  Negative for rash.  Neurological:  Negative for weakness and headaches.  Hematological:  Negative for adenopathy.     Physical Exam Triage Vital Signs ED Triage Vitals  Enc Vitals Group     BP      Pulse      Resp      Temp      Temp src      SpO2      Weight      Height      Head Circumference      Peak Flow      Pain Score      Pain Loc      Pain Edu?      Excl. in [place]?  No data found.  Updated Vital Signs BP 116/74 (BP Location: Left Arm)   Pulse 87   Temp 98.6 F (37 C) (Oral)   Ht 5\' 5"  (1.651 m)   Wt 150 lb (68 kg)   LMP 01/10/2023   SpO2 96%   BMI 24.96 kg/m      Physical Exam Vitals and nursing note reviewed.  Constitutional:      General: She is not in acute distress.    Appearance: Normal appearance. She is well-developed. She is not ill-appearing or toxic-appearing.  HENT:     Head: Normocephalic and atraumatic.     Nose: Congestion present.     Mouth/Throat:     Mouth: Mucous membranes are moist.     Pharynx: Oropharynx is clear. Posterior oropharyngeal erythema present.     Tonsils: 1+ on the right. 1+ on the left.  Eyes:     General: No scleral icterus.       Right eye: No discharge.        Left eye: No discharge.     Conjunctiva/sclera: Conjunctivae normal.  Cardiovascular:     Rate and  Rhythm: Normal rate and regular rhythm.     Heart sounds: Normal heart sounds.  Pulmonary:     Effort: Pulmonary effort is normal. No respiratory distress.     Breath sounds: Normal breath sounds.  Musculoskeletal:     Cervical back: Neck supple.  Skin:    General: Skin is dry.  Neurological:     General: No focal deficit present.     Mental Status: She is alert. Mental status is at baseline.     Motor: No weakness.     Gait: Gait normal.  Psychiatric:        Mood and Affect: Mood normal.        Behavior: Behavior normal.        Thought Content: Thought content normal.      UC Treatments / Results  Labs (all labs ordered are listed, but only abnormal results are displayed) Labs Reviewed  GROUP A STREP BY PCR    EKG   Radiology No results found.  Procedures Procedures (including critical care time)  Medications Ordered in UC Medications - No data to display  Initial Impression / Assessment and Plan / UC Course  I have reviewed the triage vital signs and the nursing notes.  Pertinent labs & imaging results that were available during my care of the patient were reviewed by me and considered in my medical decision making (see chart for details).   22 year old female presenting for sore throat, congestion and fatigue x 4 days.  Temp is 98.6 degrees. She is well appearing.  On exam she does have erythema of posterior pharynx with 1+ bilateral enlarged tonsils and nasal congestion/nasal mucosal edema.  Chest clear auscultation and heart regular rhythm.  PCR strep test performed.  Negative.  Discussed results patient. Viral URI.  Treating with Atrovent nasal spray and Bromfed DM.  Supportive care advised.  Reviewed return and ER precautions.  Work note given.   Final Clinical Impressions(s) / UC Diagnoses   Final diagnoses:  Viral upper respiratory tract infection  Sore throat  Nasal congestion     Discharge Instructions      URI/COLD SYMPTOMS: Negative strep  test. Your exam today is consistent with a viral illness. Antibiotics are not indicated at this time. Use medications as directed, including cough syrup, nasal saline, and decongestants. Your symptoms should improve over the next  few days and resolve within 7-10 days. Increase rest and fluids. F/u if symptoms worsen or predominate such as sore throat, ear pain, productive cough, shortness of breath, or if you develop high fevers or worsening fatigue over the next several days.         ED Prescriptions     Medication Sig Dispense Auth. Provider   brompheniramine-pseudoephedrine-DM 30-2-10 MG/5ML syrup Take 10 mLs by mouth 4 (four) times daily as needed for up to 7 days. 150 mL Laurene Footman B, PA-C   ipratropium (ATROVENT) 0.06 % nasal spray Place 2 sprays into both nostrils 4 (four) times daily. 15 mL Danton Clap, PA-C      PDMP not reviewed this encounter.     Danton Clap, PA-C 01/31/23 [phone]

## 2023-01-31 NOTE — ED Triage Notes (Addendum)
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

## 2023-02-05 ENCOUNTER — Ambulatory Visit
Admission: RE | Admit: 2023-02-05 | Discharge: 2023-02-05 | Disposition: A | Discharge status: Home / Self Care | Payer: Managed Care, Other (non HMO) | Source: Ambulatory Visit | Attending: Emergency Medicine | Admitting: Emergency Medicine

## 2023-02-05 VITALS — BP 115/79 | HR 110 | Temp 98.5°F | Ht 65.0 in | Wt 150.0 lb

## 2023-02-05 DIAGNOSIS — J0100 Acute maxillary sinusitis, unspecified: Secondary | ICD-10-CM | POA: Diagnosis not present

## 2023-02-05 MED ORDER — AMOXICILLIN-POT CLAVULANATE 875-125 MG PO TABS: 1.0000 | ORAL_TABLET | Freq: Two times a day (BID) | ORAL | 0 refills | Status: DC

## 2023-02-05 NOTE — ED Triage Notes (Signed)
Pt states she was seen last Wednesday, pt states symptoms staying the same no improvement

## 2023-02-05 NOTE — ED Provider Notes (Signed)
[facility]    CSN: [id number] Arrival date & time: 02/05/23  1637      History   Chief Complaint Chief Complaint  Patient presents with   Sore Throat   Nasal Congestion    HPI Marissa Harrington is a 22 y.o. female.   Patient presents for evaluation of nasal congestion, rhinorrhea and pressure, sore throat and nonproductive cough present for 9 days.  Endorses congestion making it difficult to breathe.  Was evaluated in urgent care 6 days prior, diagnosed with viral illness, prescribed Atrovent and Bromfed, deemed ineffective.  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  Tolerating food and liquids.  Denies shortness of breath or wheezing.   Past Medical History:  Diagnosis Date   Acid reflux    Anxiety     There are no problems to display for this patient.   History reviewed. No pertinent surgical history.  OB History   No obstetric history on file.      Home Medications    Prior to Admission medications   Medication Sig Start Date End Date Taking? Authorizing Provider  brompheniramine-pseudoephedrine-DM 30-2-10 MG/5ML syrup Take 10 mLs by mouth 4 (four) times daily as needed for up to 7 days. 01/31/23 02/07/23  Laurene Footman B, PA-C  ipratropium (ATROVENT) 0.06 % nasal spray Place 2 sprays into both nostrils 4 (four) times daily. 01/31/23   Laurene Footman B, PA-C  montelukast (SINGULAIR) 10 MG tablet Take 1 tablet (10 mg total) by mouth at bedtime. 05/03/21 06/02/21  Laurene Footman B, PA-C  pantoprazole (PROTONIX) 40 MG tablet Take by mouth. 07/26/20 01/22/21  [provider]  dicyclomine (BENTYL) 10 MG capsule Take by mouth. 07/28/20 03/08/21  [provider]  escitalopram (LEXAPRO) 5 MG tablet Take 5 mg by mouth daily. 07/29/20 03/08/21  [provider]  sucralfate (CARAFATE) 1 GM/10ML suspension Take by mouth. 07/05/20 03/08/21  [provider]    Family History Family History  Problem Relation Age of Onset   Healthy Mother     Healthy Father     Social History Social History   Tobacco Use   Smoking status: Never   Smokeless tobacco: Never  Vaping Use   Vaping Use: Never used  Substance Use Topics   Alcohol use: Never   Drug use: Never     Allergies   Patient has no known allergies.   Review of Systems Review of Systems  Constitutional: Negative.   HENT:  Positive for congestion, rhinorrhea, sinus pressure, sinus pain and sore throat. Negative for dental problem, drooling, ear discharge, ear pain, facial swelling, hearing loss, mouth sores, nosebleeds, postnasal drip, sneezing, tinnitus, trouble swallowing and voice change.   Respiratory:  Positive for cough. Negative for apnea, choking, chest tightness, shortness of breath, wheezing and stridor.   Cardiovascular: Negative.   Gastrointestinal: Negative.   Skin: Negative.   Neurological: Negative.      Physical Exam Triage Vital Signs ED Triage Vitals  Enc Vitals Group     BP 02/05/23 1707 115/79     Pulse Rate 02/05/23 1707 (!) 110     Resp --      Temp 02/05/23 1707 98.5 F (36.9 C)     Temp Source 02/05/23 1707 Oral     SpO2 02/05/23 1707 100 %     Weight 02/05/23 1707 150 lb (68 kg)     Height 02/05/23 1707 5' 5"$  (1.651 m)     Head Circumference --  Peak Flow --      Pain Score 02/05/23 1709 0     Pain Loc --      Pain Edu? --      Excl. in [place]? --    No data found.  Updated Vital Signs BP 115/79 (BP Location: Left Arm)   Pulse (!) 110   Temp 98.5 F (36.9 C) (Oral)   Ht 5' 5"$  (1.651 m)   Wt 150 lb (68 kg)   LMP 01/10/2023   SpO2 100%   BMI 24.96 kg/m   Visual Acuity Right Eye Distance:   Left Eye Distance:   Bilateral Distance:    Right Eye Near:   Left Eye Near:    Bilateral Near:     Physical Exam Constitutional:      Appearance: Normal appearance.  HENT:     Right Ear: Tympanic membrane, ear canal and external ear normal.     Left Ear: Tympanic membrane, ear canal and external ear normal.      Nose: Congestion and rhinorrhea present.     Mouth/Throat:     Mouth: Mucous membranes are moist.     Pharynx: Posterior oropharyngeal erythema present.     Tonsils: No tonsillar exudate. 0 on the right. 0 on the left.  Eyes:     Extraocular Movements: Extraocular movements intact.  Cardiovascular:     Rate and Rhythm: Normal rate and regular rhythm.     Pulses: Normal pulses.     Heart sounds: Normal heart sounds.  Pulmonary:     Effort: Pulmonary effort is normal.     Breath sounds: Normal breath sounds.  Skin:    General: Skin is warm and dry.  Neurological:     Mental Status: She is alert and oriented to person, place, and time. Mental status is at baseline.  Psychiatric:        Mood and Affect: Mood normal.        Behavior: Behavior normal.      UC Treatments / Results  Labs (all labs ordered are listed, but only abnormal results are displayed) Labs Reviewed - No data to display  EKG   Radiology No results found.  Procedures Procedures (including critical care time)  Medications Ordered in UC Medications - No data to display  Initial Impression / Assessment and Plan / UC Course  I have reviewed the triage vital signs and the nursing notes.  Pertinent labs & imaging results that were available during my care of the patient were reviewed by me and considered in my medical decision making (see chart for details).  Acute nonrecurrent maxillary sinusitis  Vital signs stable patient is in no signs of distress nontoxic-appearing, will defer viral testing due to timeline of illness, strep testing 6 days ago negative, symptoms are progressing with no signs of resolution we will provide bacterial coverage, Augmentin prescribed, recommended continued use of supportive additionally advised use of guaifenesin, may follow-up with urgent care as needed, work note given Final Clinical Impressions(s) / UC Diagnoses   Final diagnoses:  None   Discharge Instructions   None     ED Prescriptions   None    PDMP not reviewed this encounter.   Hans Eden, NP 02/05/23 1726

## 2023-02-05 NOTE — Discharge Instructions (Signed)
Today you are being treated for sinus infection  Begin Augmentin every morning and every evening for 7 days ideally you will see improvement in about 48 hours and steady progression from the urine  Continue use of nasal spray as well as syrup given at last visit to help further reduce congestion    You can take Tylenol and/or Ibuprofen as needed for fever reduction and pain relief.   For cough: honey 1/2 to 1 teaspoon (you can dilute the honey in water or another fluid).  You can also use guaifenesin (Robitussin or Mucinex )and dextromethorphan for cough. You can use a humidifier for chest congestion and cough.  If you don't have a humidifier, you can sit in the bathroom with the hot shower running.      For sore throat: try warm salt water gargles, cepacol lozenges, throat spray, warm tea or water with lemon/honey, popsicles or ice, or OTC cold relief medicine for throat discomfort.   For congestion: take a daily anti-histamine like Zyrtec, Claritin, and a oral decongestant, such as pseudoephedrine.  You can also use Flonase 1-2 sprays in each nostril daily.   It is important to stay hydrated: drink plenty of fluids (water, gatorade/powerade/pedialyte, juices, or teas) to keep your throat moisturized and help further relieve irritation/discomfort.

## 2023-03-11 ENCOUNTER — Encounter: Payer: Self-pay | Admitting: Emergency Medicine

## 2023-03-11 ENCOUNTER — Ambulatory Visit
Admission: EM | Admit: 2023-03-11 | Discharge: 2023-03-11 | Disposition: A | Discharge status: Home / Self Care | Payer: Managed Care, Other (non HMO) | Attending: Emergency Medicine | Admitting: Emergency Medicine

## 2023-03-11 DIAGNOSIS — J069 Acute upper respiratory infection, unspecified: Secondary | ICD-10-CM

## 2023-03-11 MED ORDER — AZITHROMYCIN 250 MG PO TABS: 250.0000 mg | ORAL_TABLET | Freq: Every day | ORAL | 0 refills | Status: DC

## 2023-03-11 MED ORDER — PREDNISONE 20 MG PO TABS: 40.0000 mg | ORAL_TABLET | Freq: Every day | ORAL | 0 refills | Status: DC

## 2023-03-11 NOTE — Discharge Instructions (Signed)
Begin taking azithromycin as directed to provide coverage for bacteria  Starting tomorrow morning take prednisone every morning with food for 5 days to help reduce inflammation and irritation which ideally will improve your cough    You can take Tylenol and/or Ibuprofen as needed for fever reduction and pain relief.   For cough: honey 1/2 to 1 teaspoon (you can dilute the honey in water or another fluid).  You can also use guaifenesin and dextromethorphan for cough. You can use a humidifier for chest congestion and cough.  If you don't have a humidifier, you can sit in the bathroom with the hot shower running.      For sore throat: try warm salt water gargles, cepacol lozenges, throat spray, warm tea or water with lemon/honey, popsicles or ice, or OTC cold relief medicine for throat discomfort.   For congestion: take a daily anti-histamine like Zyrtec, Claritin, and a oral decongestant, such as pseudoephedrine.  You can also use Flonase 1-2 sprays in each nostril daily.   It is important to stay hydrated: drink plenty of fluids (water, gatorade/powerade/pedialyte, juices, or teas) to keep your throat moisturized and help further relieve irritation/discomfort.

## 2023-03-11 NOTE — ED Provider Notes (Signed)
[facility]    CSN: [id number] Arrival date & time: 03/11/23  1355      History   Chief Complaint Chief Complaint  Patient presents with   Cough   Nasal Congestion    HPI Marissa Harrington is a 22 y.o. female.   Patient presents for nasal congestion, rhinorrhea, and non productive cough for 7 days, cough described as deep and congested. Tolerating food and liquids. Known sick contacts works at daycare,. Has attempted use of bromfed, robitussin and Mucinex. Denies shortness of breath, wheezing and fevers. Denies respiratory symptoms, non smoker.   Past Medical History:  Diagnosis Date   Acid reflux    Anxiety     There are no problems to display for this patient.   History reviewed. No pertinent surgical history.  OB History   No obstetric history on file.      Home Medications    Prior to Admission medications   Medication Sig Start Date End Date Taking? Authorizing Provider  amoxicillin-clavulanate (AUGMENTIN) 875-125 MG tablet Take 1 tablet by mouth every 12 (twelve) hours. 02/05/23   Amya Hlad, Leitha Schuller, NP  ipratropium (ATROVENT) 0.06 % nasal spray Place 2 sprays into both nostrils 4 (four) times daily. 01/31/23   Laurene Footman B, PA-C  montelukast (SINGULAIR) 10 MG tablet Take 1 tablet (10 mg total) by mouth at bedtime. 05/03/21 06/02/21  Laurene Footman B, PA-C  pantoprazole (PROTONIX) 40 MG tablet Take by mouth. 07/26/20 01/22/21  [provider]  dicyclomine (BENTYL) 10 MG capsule Take by mouth. 07/28/20 03/08/21  [provider]  escitalopram (LEXAPRO) 5 MG tablet Take 5 mg by mouth daily. 07/29/20 03/08/21  [provider]  sucralfate (CARAFATE) 1 GM/10ML suspension Take by mouth. 07/05/20 03/08/21  [provider]    Family History Family History  Problem Relation Age of Onset   Healthy Mother    Healthy Father     Social History Social History   Tobacco Use   Smoking status: Never   Smokeless tobacco: Never   Vaping Use   Vaping Use: Never used  Substance Use Topics   Alcohol use: Never   Drug use: Never     Allergies   Patient has no known allergies.   Review of Systems Review of Systems  Constitutional: Negative.   HENT:  Positive for congestion and rhinorrhea. Negative for dental problem, drooling, ear discharge, ear pain, facial swelling, hearing loss, mouth sores, nosebleeds, postnasal drip, sinus pressure, sinus pain, sneezing, sore throat, tinnitus, trouble swallowing and voice change.   Respiratory:  Positive for cough. Negative for apnea, choking, chest tightness, shortness of breath, wheezing and stridor.   Cardiovascular: Negative.   Gastrointestinal: Negative.   Skin: Negative.   Neurological: Negative.      Physical Exam Triage Vital Signs ED Triage Vitals  Enc Vitals Group     BP 03/11/23 1505 121/68     Pulse Rate 03/11/23 1505 83     Resp 03/11/23 1505 14     Temp 03/11/23 1505 98.1 F (36.7 C)     Temp Source 03/11/23 1505 Oral     SpO2 03/11/23 1505 100 %     Weight 03/11/23 1504 150 lb (68 kg)     Height 03/11/23 1504 5\' 5"  (1.651 m)     Head Circumference --      Peak Flow --      Pain Score 03/11/23 1504 0     Pain Loc --  Pain Edu? --      Excl. in [place]? --    No data found.  Updated Vital Signs BP 121/68 (BP Location: Right Arm)   Pulse 83   Temp 98.1 F (36.7 C) (Oral)   Resp 14   Ht 5\' 5"  (1.651 m)   Wt 150 lb (68 kg)   LMP 02/11/2023 (Approximate)   SpO2 100%   BMI 24.96 kg/m   Visual Acuity Right Eye Distance:   Left Eye Distance:   Bilateral Distance:    Right Eye Near:   Left Eye Near:    Bilateral Near:     Physical Exam Constitutional:      Appearance: Normal appearance.  HENT:     Head: Normocephalic.     Right Ear: Tympanic membrane, ear canal and external ear normal.     Left Ear: Tympanic membrane, ear canal and external ear normal.     Nose: Congestion and rhinorrhea present.     Mouth/Throat:     Mouth:  Mucous membranes are moist.     Pharynx: No posterior oropharyngeal erythema.  Cardiovascular:     Rate and Rhythm: Normal rate and regular rhythm.     Pulses: Normal pulses.     Heart sounds: Normal heart sounds.  Pulmonary:     Effort: Pulmonary effort is normal.     Breath sounds: Normal breath sounds.  Skin:    General: Skin is warm and dry.  Neurological:     Mental Status: She is alert and oriented to person, place, and time. Mental status is at baseline.  Psychiatric:        Mood and Affect: Mood normal.        Behavior: Behavior normal.      UC Treatments / Results  Labs (all labs ordered are listed, but only abnormal results are displayed) Labs Reviewed - No data to display  EKG   Radiology No results found.  Procedures Procedures (including critical care time)  Medications Ordered in UC Medications - No data to display  Initial Impression / Assessment and Plan / UC Course  I have reviewed the triage vital signs and the nursing notes.  Pertinent labs & imaging results that were available during my care of the patient were reviewed by me and considered in my medical decision making (see chart for details).  Acute URI  Patient is in no signs of distress nor toxic appearing.  Vital signs are stable.  Low suspicion for pneumonia, pneumothorax or bronchitis and therefore will defer imaging.viral testing deferred due to timeline.Prescribed z-pac prophylactically, prednisone given for coughing May use additional over-the-counter medications as needed for supportive care.  May follow-up with urgent care as needed if symptoms persist or worsen.      Final Clinical Impressions(s) / UC Diagnoses   Final diagnoses:  None   Discharge Instructions   None    ED Prescriptions   None    PDMP not reviewed this encounter.   Hans Eden, NP 03/11/23 1555

## 2023-03-11 NOTE — ED Triage Notes (Signed)
Patient c/o cough, chest congestion, and nasal congestion for a week.  Patient unsure of fevers.

## 2023-05-17 ENCOUNTER — Ambulatory Visit: Payer: Managed Care, Other (non HMO) | Admitting: Nurse Practitioner

## 2023-05-17 ENCOUNTER — Encounter: Payer: Self-pay | Admitting: Nurse Practitioner

## 2023-05-17 VITALS — BP 119/77 | HR 71 | Temp 98.1°F | Wt 165.8 lb

## 2023-05-17 DIAGNOSIS — N926 Irregular menstruation, unspecified: Secondary | ICD-10-CM

## 2023-05-17 DIAGNOSIS — Z6827 Body mass index (BMI) 27.0-27.9, adult: Secondary | ICD-10-CM

## 2023-05-17 MED ORDER — WEGOVY 0.25 MG/0.5ML ~~LOC~~ SOAJ: 0.2500 mg | SUBCUTANEOUS | 0 refills | Status: DC

## 2023-05-17 MED ORDER — BUPROPION HCL 75 MG PO TABS: 75.0000 mg | ORAL_TABLET | Freq: Every day | ORAL | 0 refills | Status: DC

## 2023-05-17 MED ORDER — ONDANSETRON HCL 4 MG PO TABS: 4.0000 mg | ORAL_TABLET | Freq: Three times a day (TID) | ORAL | 1 refills | Status: DC | PRN

## 2023-05-17 NOTE — Progress Notes (Signed)
BP 119/77   Pulse 71   Temp 98.1 F (36.7 C) (Oral)   Wt 165 lb 12.8 oz (75.2 kg)   LMP 05/08/2023 (Exact Date)   SpO2 99%   BMI 27.59 kg/m    Subjective:    Patient ID: Marissa Harrington, female    DOB: [birth year], 22 y.o.   MRN: [id number]  HPI: Marissa Harrington is a 22 y.o. female  Chief Complaint  Patient presents with   Menstrual Problem   Patient presents to clinic to establish care with new PCP.  Introduced to Publishing rights manager role and practice setting.  All questions answered.  Discussed provider/patient relationship and expectations.  Patient reports a history of vit d def.   Patient denies a history of: Hypertension, Elevated Cholesterol, Diabetes, Thyroid problems, Depression, Anxiety, Neurological problems, and Abdominal problems.   Patient states a week before she starts her periods she is really nausea, her legs hurt, some headaches, and very emotional, and feel like things get to you very quickly.  She has been taking dramamine for nausea which normally helps but hasn't been lately.  She is bleeding for at least a week and sometimes longer.  Soaking through at least 4-5 pads per day.  5 days of heavy bleeding then slow down.      Active Ambulatory Problems    Diagnosis Date Noted   Menstrual bleeding problem 05/17/2023   BMI 27.0-27.9,adult 05/17/2023   Resolved Ambulatory Problems    Diagnosis Date Noted   No Resolved Ambulatory Problems   Past Medical History:  Diagnosis Date   Acid reflux    Anxiety    History reviewed. No pertinent surgical history. Family History  Problem Relation Age of Onset   Healthy Mother    Healthy Father      Review of Systems  Constitutional:  Positive for unexpected weight change.  Gastrointestinal:  Positive for nausea.  Genitourinary:  Positive for menstrual problem.  Psychiatric/Behavioral:  Positive for behavioral problems.     Per HPI unless specifically indicated above     Objective:    BP 119/77    Pulse 71   Temp 98.1 F (36.7 C) (Oral)   Wt 165 lb 12.8 oz (75.2 kg)   LMP 05/08/2023 (Exact Date)   SpO2 99%   BMI 27.59 kg/m   Wt Readings from Last 3 Encounters:  05/17/23 165 lb 12.8 oz (75.2 kg)  03/11/23 150 lb (68 kg)  02/05/23 150 lb (68 kg)    Physical Exam Vitals and nursing note reviewed.  Constitutional:      General: She is not in acute distress.    Appearance: Normal appearance. She is normal weight. She is not ill-appearing, toxic-appearing or diaphoretic.  HENT:     Head: Normocephalic.     Right Ear: External ear normal.     Left Ear: External ear normal.     Nose: Nose normal.     Mouth/Throat:     Mouth: Mucous membranes are moist.     Pharynx: Oropharynx is clear.  Eyes:     General:        Right eye: No discharge.        Left eye: No discharge.     Extraocular Movements: Extraocular movements intact.     Conjunctiva/sclera: Conjunctivae normal.     Pupils: Pupils are equal, round, and reactive to light.  Cardiovascular:     Rate and Rhythm: Normal rate and regular rhythm.     Heart sounds:  No murmur heard. Pulmonary:     Effort: Pulmonary effort is normal. No respiratory distress.     Breath sounds: Normal breath sounds. No wheezing or rales.  Musculoskeletal:     Cervical back: Normal range of motion and neck supple.  Skin:    General: Skin is warm and dry.     Capillary Refill: Capillary refill takes less than 2 seconds.  Neurological:     General: No focal deficit present.     Mental Status: She is alert and oriented to person, place, and time. Mental status is at baseline.  Psychiatric:        Mood and Affect: Mood normal.        Behavior: Behavior normal.        Thought Content: Thought content normal.        Judgment: Judgment normal.     Results for orders placed or performed during the hospital encounter of 01/31/23  Group A Strep by PCR   Specimen: Throat; Sterile Swab  Result Value Ref Range   Group A Strep by PCR NOT  DETECTED NOT DETECTED      Assessment & Plan:   Problem List Items Addressed This Visit       Other   Menstrual bleeding problem - Primary    Chronic.  Hesitant to do SSRI or birth control due to weight gain.  Will try Wellbutrin 75mg  daily to see if symptoms improve.  Side effects and benefits of medication discussed during visit. Follow up in 1 month.  Call sooner if concerns arise.       BMI 27.0-27.9,adult    Chronic.  Would like to start weight loss medication.  Will start [facility] 0.25mg  weekly.  Side effects and benefits discussed.  Discussed how to titrate medication.  Discussed how to inject medication.  Follow up in 1 month.  Call sooner if concerns arise.         Follow up plan: Return in about 1 month (around 06/17/2023) for Physical and Fasting labs.

## 2023-05-17 NOTE — Assessment & Plan Note (Signed)
Chronic.  Would like to start weight loss medication.  Will start [facility] 0.25mg  weekly.  Side effects and benefits discussed.  Discussed how to titrate medication.  Discussed how to inject medication.  Follow up in 1 month.  Call sooner if concerns arise.

## 2023-05-17 NOTE — Assessment & Plan Note (Signed)
Chronic.  Hesitant to do SSRI or birth control due to weight gain.  Will try Wellbutrin 75mg  daily to see if symptoms improve.  Side effects and benefits of medication discussed during visit. Follow up in 1 month.  Call sooner if concerns arise.

## 2023-05-21 ENCOUNTER — Encounter: Payer: Self-pay | Admitting: Nurse Practitioner

## 2023-05-22 ENCOUNTER — Telehealth: Payer: Self-pay

## 2023-05-22 NOTE — Telephone Encounter (Signed)
PA for [facility] initiated and submitted via Cover My Meds. Key: [id number]  Awaiting determination from the insurance company.

## 2023-05-22 NOTE — Telephone Encounter (Signed)
PA denied. Will notify patient via Mychart message.

## 2023-06-12 ENCOUNTER — Other Ambulatory Visit: Payer: Self-pay | Admitting: Nurse Practitioner

## 2023-06-12 NOTE — Telephone Encounter (Signed)
Requested Prescriptions  Pending Prescriptions Disp Refills   buPROPion (WELLBUTRIN) 75 MG tablet [Pharmacy Med Name: BUPROPION HCL 75 MG TABLET] 90 tablet 0    Sig: TAKE 1 TABLET BY MOUTH EVERY DAY     Psychiatry: Antidepressants - bupropion Failed - 06/12/2023  8:31 AM      Failed - Cr in normal range and within 360 days    Creatinine, Ser  Date Value Ref Range Status  07/31/2020 0.71 0.44 - 1.00 mg/dL Final         Failed - AST in normal range and within 360 days    No results found for: "POCAST", "AST"       Failed - ALT in normal range and within 360 days    No results found for: "ALT", "LABALT", "POCALT"       Passed - Last BP in normal range    BP Readings from Last 1 Encounters:  05/17/23 119/77         Passed - Valid encounter within last 6 months    Recent Outpatient Visits           3 weeks ago Menstrual bleeding problem   [facility] Larae Grooms, NP       Future Appointments             Tomorrow Larae Grooms, NP [facility]

## 2023-06-13 ENCOUNTER — Encounter: Payer: Self-pay | Admitting: Nurse Practitioner

## 2023-06-13 ENCOUNTER — Ambulatory Visit (INDEPENDENT_AMBULATORY_CARE_PROVIDER_SITE_OTHER): Payer: Managed Care, Other (non HMO) | Admitting: Nurse Practitioner

## 2023-06-13 VITALS — BP 102/67 | HR 64 | Temp 98.0°F | Ht 65.35 in | Wt 166.2 lb

## 2023-06-13 DIAGNOSIS — Z136 Encounter for screening for cardiovascular disorders: Secondary | ICD-10-CM

## 2023-06-13 DIAGNOSIS — Z1159 Encounter for screening for other viral diseases: Secondary | ICD-10-CM

## 2023-06-13 DIAGNOSIS — Z114 Encounter for screening for human immunodeficiency virus [HIV]: Secondary | ICD-10-CM | POA: Diagnosis not present

## 2023-06-13 DIAGNOSIS — E559 Vitamin D deficiency, unspecified: Secondary | ICD-10-CM

## 2023-06-13 DIAGNOSIS — N926 Irregular menstruation, unspecified: Secondary | ICD-10-CM

## 2023-06-13 DIAGNOSIS — Z0000 Encounter for general adult medical examination without abnormal findings: Secondary | ICD-10-CM | POA: Diagnosis not present

## 2023-06-13 LAB — URINALYSIS, ROUTINE W REFLEX MICROSCOPIC
Bilirubin, UA: NEGATIVE
Glucose, UA: NEGATIVE
Ketones, UA: NEGATIVE
Nitrite, UA: NEGATIVE
Protein,UA: NEGATIVE
Specific Gravity, UA: 1.02 (ref 1.005–1.030)
Urobilinogen, Ur: 0.2 mg/dL (ref 0.2–1.0)
pH, UA: 7 (ref 5.0–7.5)

## 2023-06-13 LAB — MICROSCOPIC EXAMINATION: Bacteria, UA: NONE SEEN

## 2023-06-13 MED ORDER — BUPROPION HCL ER (XL) 150 MG PO TB24: 150.0000 mg | ORAL_TABLET | Freq: Every day | ORAL | 1 refills | Status: DC

## 2023-06-13 NOTE — Assessment & Plan Note (Signed)
Chronic.  Improved with Wellbutrin 75mg  daily.  Will increase dose to Wellbutrin 150mg  daily.  Follow up in 4 months.  Call sooner if concerns arise.

## 2023-06-13 NOTE — Progress Notes (Signed)
BP 102/67   Pulse 64   Temp 98 F (36.7 C) (Oral)   Ht 5' 5.35" (1.66 m)   Wt 166 lb 3.2 oz (75.4 kg)   LMP 06/11/2023 (Approximate)   SpO2 98%   BMI 27.36 kg/m    Subjective:    Patient ID: Marissa Harrington, female    DOB: 01-Nov-2001, 22 y.o.   MRN: [id number]  HPI: Marissa Harrington is a 22 y.o. female presenting on 06/13/2023 for comprehensive medical examination. Current medical complaints include:none  She currently lives with: Menopausal Symptoms: no  MENSTRUAL PROBLEM Patient states she likes the Wellbutrin.  Would like to increase the dose.  Feels like it is helping her mood.  Denies SI.    Depression Screen done today and results listed below:     05/17/2023    3:05 PM  Depression screen PHQ 2/9  Decreased Interest 0  Down, Depressed, Hopeless 0  PHQ - 2 Score 0  Altered sleeping 0  Tired, decreased energy 0  Change in appetite 0  Feeling bad or failure about yourself  0  Trouble concentrating 0  Moving slowly or fidgety/restless 0  Suicidal thoughts 0  PHQ-9 Score 0    The patient does not have a history of falls. I did complete a risk assessment for falls. A plan of care for falls was documented.   Past Medical History:  Past Medical History:  Diagnosis Date   Acid reflux    Anxiety     Surgical History:  History reviewed. No pertinent surgical history.  Medications:  Current Outpatient Medications on File Prior to Visit  Medication Sig   Vitamin D, Ergocalciferol, (DRISDOL) 1.25 MG (50000 UNIT) CAPS capsule Take 50,000 Units by mouth 2 (two) times a week. (Patient not taking: Reported on 06/13/2023)   [DISCONTINUED] dicyclomine (BENTYL) 10 MG capsule Take by mouth.   [DISCONTINUED] escitalopram (LEXAPRO) 5 MG tablet Take 5 mg by mouth daily.   [DISCONTINUED] sucralfate (CARAFATE) 1 GM/10ML suspension Take by mouth.   No current facility-administered medications on file prior to visit.    Allergies:  No Known Allergies  Social History:   Social History   Socioeconomic History   Marital status: Single    Spouse name: Not on file   Number of children: Not on file   Years of education: Not on file   Highest education level: Not on file  Occupational History   Not on file  Tobacco Use   Smoking status: Never   Smokeless tobacco: Never  Vaping Use   Vaping Use: Never used  Substance and Sexual Activity   Alcohol use: Never   Drug use: Never   Sexual activity: Never  Other Topics Concern   Not on file  Social History Narrative   Not on file   Social Determinants of Health   Financial Resource Strain: Not on file  Food Insecurity: Not on file  Transportation Needs: Not on file  Physical Activity: Not on file  Stress: Not on file  Social Connections: Not on file  Intimate Partner Violence: Not on file   Social History   Tobacco Use  Smoking Status Never  Smokeless Tobacco Never   Social History   Substance and Sexual Activity  Alcohol Use Never    Family History:  Family History  Problem Relation Age of Onset   Healthy Mother    Healthy Father     Past medical history, surgical history, medications, allergies, family history and social history  reviewed with patient today and changes made to appropriate areas of the chart.   Review of Systems  Eyes:  Negative for blurred vision and double vision.  Respiratory:  Negative for shortness of breath.   Cardiovascular:  Negative for chest pain, palpitations and leg swelling.  Neurological:  Negative for dizziness and headaches.  Psychiatric/Behavioral:  Positive for depression. Negative for suicidal ideas. The patient is nervous/anxious.    All other ROS negative except what is listed above and in the HPI.      Objective:    BP 102/67   Pulse 64   Temp 98 F (36.7 C) (Oral)   Ht 5' 5.35" (1.66 m)   Wt 166 lb 3.2 oz (75.4 kg)   LMP 06/11/2023 (Approximate)   SpO2 98%   BMI 27.36 kg/m   Wt Readings from Last 3 Encounters:  06/13/23 166 lb  3.2 oz (75.4 kg)  05/17/23 165 lb 12.8 oz (75.2 kg)  03/11/23 150 lb (68 kg)    Physical Exam Vitals and nursing note reviewed.  Constitutional:      General: She is awake. She is not in acute distress.    Appearance: Normal appearance. She is well-developed. She is not ill-appearing.  HENT:     Head: Normocephalic and atraumatic.     Right Ear: Hearing, tympanic membrane, ear canal and external ear normal. No drainage.     Left Ear: Hearing, tympanic membrane, ear canal and external ear normal. No drainage.     Nose: Nose normal.     Right Sinus: No maxillary sinus tenderness or frontal sinus tenderness.     Left Sinus: No maxillary sinus tenderness or frontal sinus tenderness.     Mouth/Throat:     Mouth: Mucous membranes are moist.     Pharynx: Oropharynx is clear. Uvula midline. No pharyngeal swelling, oropharyngeal exudate or posterior oropharyngeal erythema.  Eyes:     General: Lids are normal.        Right eye: No discharge.        Left eye: No discharge.     Extraocular Movements: Extraocular movements intact.     Conjunctiva/sclera: Conjunctivae normal.     Pupils: Pupils are equal, round, and reactive to light.     Visual Fields: Right eye visual fields normal and left eye visual fields normal.  Neck:     Thyroid: No thyromegaly.     Vascular: No carotid bruit.     Trachea: Trachea normal.  Cardiovascular:     Rate and Rhythm: Normal rate and regular rhythm.     Heart sounds: Normal heart sounds. No murmur heard.    No gallop.  Pulmonary:     Effort: Pulmonary effort is normal. No accessory muscle usage or respiratory distress.     Breath sounds: Normal breath sounds.  Chest:  Breasts:    Right: Normal.     Left: Normal.  Abdominal:     General: Bowel sounds are normal.     Palpations: Abdomen is soft. There is no hepatomegaly or splenomegaly.     Tenderness: There is no abdominal tenderness.  Musculoskeletal:        General: Normal range of motion.      Cervical back: Normal range of motion and neck supple.     Right lower leg: No edema.     Left lower leg: No edema.  Lymphadenopathy:     Head:     Right side of head: No submental, submandibular, tonsillar, preauricular or posterior  auricular adenopathy.     Left side of head: No submental, submandibular, tonsillar, preauricular or posterior auricular adenopathy.     Cervical: No cervical adenopathy.     Upper Body:     Right upper body: No supraclavicular, axillary or pectoral adenopathy.     Left upper body: No supraclavicular, axillary or pectoral adenopathy.  Skin:    General: Skin is warm and dry.     Capillary Refill: Capillary refill takes less than 2 seconds.     Findings: No rash.  Neurological:     Mental Status: She is alert and oriented to person, place, and time.     Gait: Gait is intact.  Psychiatric:        Attention and Perception: Attention normal.        Mood and Affect: Mood normal.        Speech: Speech normal.        Behavior: Behavior normal. Behavior is cooperative.        Thought Content: Thought content normal.        Judgment: Judgment normal.     Results for orders placed or performed during the hospital encounter of 01/31/23  Group A Strep by PCR   Specimen: Throat; Sterile Swab  Result Value Ref Range   Group A Strep by PCR NOT DETECTED NOT DETECTED      Assessment & Plan:   Problem List Items Addressed This Visit       Other   Menstrual bleeding problem    Chronic.  Improved with Wellbutrin 75mg  daily.  Will increase dose to Wellbutrin 150mg  daily.  Follow up in 4 months.  Call sooner if concerns arise.       Other Visit Diagnoses     Annual physical exam    -  Primary   Health maintenance reviewed during visit today.  Labs ordered.  Declined vaccines.  Will get PAP at next visit.   Relevant Orders   CBC with Differential/Platelet   Comprehensive metabolic panel   Lipid panel   TSH   Urinalysis, Routine w reflex microscopic    GC/Chlamydia Probe Amp   HIV Antibody (routine testing w rflx)   Hepatitis C Antibody   Screening for ischemic heart disease       Relevant Orders   Lipid panel   Encounter for hepatitis C screening test for low risk patient       Relevant Orders   Hepatitis C Antibody   Screening for HIV (human immunodeficiency virus)       Relevant Orders   HIV Antibody (routine testing w rflx)   Vitamin D deficiency       Relevant Orders   Vitamin D (25 hydroxy)        Follow up plan: Return in about 4 months (around 10/13/2023) for Depression/Anxiety FU.   LABORATORY TESTING:  - Pap smear:  Will do at next visit  IMMUNIZATIONS:   - Tdap: Tetanus vaccination status reviewed: declined. - Influenza: Postponed to flu season - Pneumovax: Not applicable - Prevnar: Not applicable - COVID: Not applicable - HPV: Refused - Shingrix vaccine: Not applicable  SCREENING: -Mammogram: Not applicable  - Colonoscopy: Not applicable  - Bone Density: Not applicable  -Hearing Test: Not applicable  -Spirometry: Not applicable   PATIENT COUNSELING:   Advised to take 1 mg of folate supplement per day if capable of pregnancy.   Sexuality: Discussed sexually transmitted diseases, partner selection, use of condoms, avoidance of unintended pregnancy  and  contraceptive alternatives.   Advised to avoid cigarette smoking.  I discussed with the patient that most people either abstain from alcohol or drink within safe limits (<=14/week and <=4 drinks/occasion for males, <=7/weeks and <= 3 drinks/occasion for females) and that the risk for alcohol disorders and other health effects rises proportionally with the number of drinks per week and how often a drinker exceeds daily limits.  Discussed cessation/primary prevention of drug use and availability of treatment for abuse.   Diet: Encouraged to adjust caloric intake to maintain  or achieve ideal body weight, to reduce intake of dietary saturated fat and total  fat, to limit sodium intake by avoiding high sodium foods and not adding table salt, and to maintain adequate dietary potassium and calcium preferably from fresh fruits, vegetables, and low-fat dairy products.    stressed the importance of regular exercise  Injury prevention: Discussed safety belts, safety helmets, smoke detector, smoking near bedding or upholstery.   Dental health: Discussed importance of regular tooth brushing, flossing, and dental visits.    NEXT PREVENTATIVE PHYSICAL DUE IN 1 YEAR. Return in about 4 months (around 10/13/2023) for Depression/Anxiety FU.

## 2023-06-14 LAB — COMPREHENSIVE METABOLIC PANEL
ALT: 11 IU/L (ref 0–32)
AST: 13 IU/L (ref 0–40)
Albumin: 4.3 g/dL (ref 4.0–5.0)
Alkaline Phosphatase: 81 IU/L (ref 44–121)
BUN/Creatinine Ratio: 13 (ref 9–23)
BUN: 10 mg/dL (ref 6–20)
Bilirubin Total: 0.4 mg/dL (ref 0.0–1.2)
CO2: 26 mmol/L (ref 20–29)
Calcium: 9.8 mg/dL (ref 8.7–10.2)
Chloride: 98 mmol/L (ref 96–106)
Creatinine, Ser: 0.75 mg/dL (ref 0.57–1.00)
Globulin, Total: 2.8 g/dL (ref 1.5–4.5)
Glucose: 80 mg/dL (ref 70–99)
Potassium: 3.9 mmol/L (ref 3.5–5.2)
Sodium: 139 mmol/L (ref 134–144)
Total Protein: 7.1 g/dL (ref 6.0–8.5)
eGFR: 116 mL/min/{1.73_m2} (ref >59)

## 2023-06-14 LAB — CBC WITH DIFFERENTIAL/PLATELET
Basophils Absolute: 0.1 10*3/uL (ref 0.0–0.2)
Basos: 1 %
EOS (ABSOLUTE): 0.1 10*3/uL (ref 0.0–0.4)
Eos: 1 %
Hematocrit: 35.7 % (ref 34.0–46.6)
Hemoglobin: 11.4 g/dL (ref 11.1–15.9)
Immature Grans (Abs): 0 10*3/uL (ref 0.0–0.1)
Immature Granulocytes: 0 %
Lymphocytes Absolute: 4.5 10*3/uL — ABNORMAL HIGH (ref 0.7–3.1)
Lymphs: 41 %
MCH: 24.8 pg — ABNORMAL LOW (ref 26.6–33.0)
MCHC: 31.9 g/dL (ref 31.5–35.7)
MCV: 78 fL — ABNORMAL LOW (ref 79–97)
Monocytes Absolute: 0.5 10*3/uL (ref 0.1–0.9)
Monocytes: 5 %
Neutrophils Absolute: 5.8 10*3/uL (ref 1.4–7.0)
Neutrophils: 52 %
Platelets: 334 10*3/uL (ref 150–450)
RBC: 4.59 x10E6/uL (ref 3.77–5.28)
RDW: 15.4 % (ref 11.7–15.4)
WBC: 11 10*3/uL — ABNORMAL HIGH (ref 3.4–10.8)

## 2023-06-14 LAB — LIPID PANEL
Chol/HDL Ratio: 4.2 ratio (ref 0.0–4.4)
Cholesterol, Total: 134 mg/dL (ref 100–199)
HDL: 32 mg/dL — ABNORMAL LOW (ref >39)
LDL Chol Calc (NIH): 61 mg/dL (ref 0–99)
Triglycerides: 256 mg/dL — ABNORMAL HIGH (ref 0–149)
VLDL Cholesterol Cal: 41 mg/dL — ABNORMAL HIGH (ref 5–40)

## 2023-06-14 LAB — VITAMIN D 25 HYDROXY (VIT D DEFICIENCY, FRACTURES): Vit D, 25-Hydroxy: 43.2 ng/mL (ref 30.0–100.0)

## 2023-06-14 LAB — HEPATITIS C ANTIBODY: Hep C Virus Ab: NONREACTIVE

## 2023-06-14 LAB — HIV ANTIBODY (ROUTINE TESTING W REFLEX): HIV Screen 4th Generation wRfx: NONREACTIVE

## 2023-06-14 LAB — TSH: TSH: 2 u[IU]/mL (ref 0.450–4.500)

## 2023-06-14 NOTE — Progress Notes (Signed)
HI Marissa Harrington. It was nice to see you yesterday.  Your lab work looks good.  Your triglycerides are elevated but it was likely due to not fasting yesterday.  No concerns at this time. Continue with your current medication regimen.  Follow up as discussed.  Please let me know if you have any questions.

## 2023-06-16 LAB — GC/CHLAMYDIA PROBE AMP
Chlamydia trachomatis, NAA: NEGATIVE
Neisseria Gonorrhoeae by PCR: NEGATIVE

## 2023-06-18 NOTE — Progress Notes (Signed)
HI Eldine. Your chlamydia screening was negative.

## 2023-09-02 ENCOUNTER — Encounter: Payer: Self-pay | Admitting: Nurse Practitioner

## 2023-09-09 ENCOUNTER — Encounter: Payer: Self-pay | Admitting: Nurse Practitioner

## 2023-09-12 MED ORDER — BUPROPION HCL ER (XL) 150 MG PO TB24: 150.0000 mg | ORAL_TABLET | Freq: Every day | ORAL | 1 refills | Status: DC

## 2023-09-14 MED ORDER — BUPROPION HCL ER (XL) 150 MG PO TB24: 150.0000 mg | ORAL_TABLET | Freq: Every day | ORAL | 0 refills | Status: DC

## 2023-09-14 NOTE — Addendum Note (Signed)
Addended by: Jacquelin Hawking on: 09/14/2023 11:36 AM   Modules accepted: Orders

## 2023-10-15 ENCOUNTER — Ambulatory Visit: Payer: Managed Care, Other (non HMO) | Admitting: Nurse Practitioner

## 2023-11-05 ENCOUNTER — Encounter: Payer: Self-pay | Admitting: Nurse Practitioner

## 2023-11-05 ENCOUNTER — Other Ambulatory Visit: Payer: Self-pay | Admitting: Nurse Practitioner

## 2023-11-05 ENCOUNTER — Ambulatory Visit: Payer: Managed Care, Other (non HMO) | Admitting: Nurse Practitioner

## 2023-11-05 VITALS — BP 107/69 | HR 90 | Temp 97.7°F | Ht 65.0 in | Wt 162.2 lb

## 2023-11-05 DIAGNOSIS — Z6827 Body mass index (BMI) 27.0-27.9, adult: Secondary | ICD-10-CM

## 2023-11-05 DIAGNOSIS — N926 Irregular menstruation, unspecified: Secondary | ICD-10-CM

## 2023-11-05 MED ORDER — BUPROPION HCL ER (XL) 300 MG PO TB24: 300.0000 mg | ORAL_TABLET | Freq: Every day | ORAL | 0 refills | Status: DC

## 2023-11-05 MED ORDER — WEGOVY 0.25 MG/0.5ML ~~LOC~~ SOAJ: 0.2500 mg | SUBCUTANEOUS | 0 refills | Status: DC

## 2023-11-05 MED ORDER — WEGOVY 0.5 MG/0.5ML ~~LOC~~ SOAJ: 0.5000 mg | SUBCUTANEOUS | 0 refills | Status: DC

## 2023-11-05 NOTE — Assessment & Plan Note (Signed)
Improved with Wellbutin but feels like it could be better.  Will increase Wellbutrin to 300mg  daily.  Follow up in 1 month.  Call sooner if concerns arise.

## 2023-11-05 NOTE — Assessment & Plan Note (Signed)
Chronic.  Would like to start weight loss medication.  Will start [facility] 0.25mg  weekly.  Side effects and benefits discussed.  Discussed how to titrate medication.  Discussed how to inject medication.  Follow up in 1 month.  Call sooner if concerns arise.

## 2023-11-05 NOTE — Progress Notes (Signed)
BP 107/69 (BP Location: Right Arm, Patient Position: Sitting, Cuff Size: Large)   Pulse 90   Temp 97.7 F (36.5 C) (Oral)   Ht 5\' 5"  (1.651 m)   Wt 162 lb 3.2 oz (73.6 kg)   SpO2 98%   BMI 26.99 kg/m    Subjective:    Patient ID: Marissa Harrington, female    DOB: Jul 05, 2001, 22 y.o.   MRN: [id number]  HPI: Marissa Harrington is a 22 y.o. female  Chief Complaint  Patient presents with   4 month follow up    Would like to discuss weight management    Depression   Anxiety   Medication Refill    Wellbutrin   MOOD Patient states she feels like the medication helps some days then other days she doesn't feel like it is helping.  She is okay with increasing the dose.  Denies SI.  WEIGHT GAIN Duration: years Previous attempts at weight loss: yes Complications of obesity: depression and back pain Peak weight: 166lb Weight loss goal: 120lb Weight loss to date: 6 months Requesting obesity pharmacotherapy: yes Current weight loss supplements/medications: no Previous weight loss supplements/meds: no Calories:       Relevant past medical, surgical, family and social history reviewed and updated as indicated. Interim medical history since our last visit reviewed. Allergies and medications reviewed and updated.  Review of Systems  Constitutional:  Positive for unexpected weight change.  Psychiatric/Behavioral:  Positive for dysphoric mood. Negative for suicidal ideas. The patient is nervous/anxious.     Per HPI unless specifically indicated above     Objective:    BP 107/69 (BP Location: Right Arm, Patient Position: Sitting, Cuff Size: Large)   Pulse 90   Temp 97.7 F (36.5 C) (Oral)   Ht 5\' 5"  (1.651 m)   Wt 162 lb 3.2 oz (73.6 kg)   SpO2 98%   BMI 26.99 kg/m   Wt Readings from Last 3 Encounters:  11/05/23 162 lb 3.2 oz (73.6 kg)  06/13/23 166 lb 3.2 oz (75.4 kg)  05/17/23 165 lb 12.8 oz (75.2 kg)    Physical Exam Vitals and nursing note reviewed.   Constitutional:      General: She is not in acute distress.    Appearance: Normal appearance. She is not ill-appearing, toxic-appearing or diaphoretic.  HENT:     Head: Normocephalic.     Right Ear: External ear normal.     Left Ear: External ear normal.     Nose: Nose normal.     Mouth/Throat:     Mouth: Mucous membranes are moist.     Pharynx: Oropharynx is clear.  Eyes:     General:        Right eye: No discharge.        Left eye: No discharge.     Extraocular Movements: Extraocular movements intact.     Conjunctiva/sclera: Conjunctivae normal.     Pupils: Pupils are equal, round, and reactive to light.  Cardiovascular:     Rate and Rhythm: Normal rate and regular rhythm.     Heart sounds: No murmur heard. Pulmonary:     Effort: Pulmonary effort is normal. No respiratory distress.     Breath sounds: Normal breath sounds. No wheezing or rales.  Musculoskeletal:     Cervical back: Normal range of motion and neck supple.  Skin:    General: Skin is warm and dry.     Capillary Refill: Capillary refill takes less than 2 seconds.  Neurological:     General: No focal deficit present.     Mental Status: She is alert and oriented to person, place, and time. Mental status is at baseline.  Psychiatric:        Mood and Affect: Mood normal.        Behavior: Behavior normal.        Thought Content: Thought content normal.        Judgment: Judgment normal.     Results for orders placed or performed in visit on 06/13/23  GC/Chlamydia Probe Amp   Specimen: Urine   UR  Result Value Ref Range   Chlamydia trachomatis, NAA Negative Negative   Neisseria Gonorrhoeae by PCR Negative Negative  Microscopic Examination   Urine  Result Value Ref Range   WBC, UA 0-5 0 - 5 /hpf   RBC, Urine 0-2 0 - 2 /hpf   Epithelial Cells (non renal) 0-10 0 - 10 /hpf   Bacteria, UA None seen None seen/Few  CBC with Differential/Platelet  Result Value Ref Range   WBC 11.0 (H) 3.4 - 10.8 x10E3/uL   RBC  4.59 3.77 - 5.28 x10E6/uL   Hemoglobin 11.4 11.1 - 15.9 g/dL   Hematocrit 16.1 09.6 - 46.6 %   MCV 78 (L) 79 - 97 fL   MCH 24.8 (L) 26.6 - 33.0 pg   MCHC 31.9 31.5 - 35.7 g/dL   RDW 04.5 40.9 - 81.1 %   Platelets 334 150 - 450 x10E3/uL   Neutrophils 52 Not Estab. %   Lymphs 41 Not Estab. %   Monocytes 5 Not Estab. %   Eos 1 Not Estab. %   Basos 1 Not Estab. %   Neutrophils Absolute 5.8 1.4 - 7.0 x10E3/uL   Lymphocytes Absolute 4.5 (H) 0.7 - 3.1 x10E3/uL   Monocytes Absolute 0.5 0.1 - 0.9 x10E3/uL   EOS (ABSOLUTE) 0.1 0.0 - 0.4 x10E3/uL   Basophils Absolute 0.1 0.0 - 0.2 x10E3/uL   Immature Granulocytes 0 Not Estab. %   Immature Grans (Abs) 0.0 0.0 - 0.1 x10E3/uL  Comprehensive metabolic panel  Result Value Ref Range   Glucose 80 70 - 99 mg/dL   BUN 10 6 - 20 mg/dL   Creatinine, Ser 9.14 0.57 - 1.00 mg/dL   eGFR 782 >95 AO/ZHY/8.65   BUN/Creatinine Ratio 13 9 - 23   Sodium 139 134 - 144 mmol/L   Potassium 3.9 3.5 - 5.2 mmol/L   Chloride 98 96 - 106 mmol/L   CO2 26 20 - 29 mmol/L   Calcium 9.8 8.7 - 10.2 mg/dL   Total Protein 7.1 6.0 - 8.5 g/dL   Albumin 4.3 4.0 - 5.0 g/dL   Globulin, Total 2.8 1.5 - 4.5 g/dL   Bilirubin Total 0.4 0.0 - 1.2 mg/dL   Alkaline Phosphatase 81 44 - 121 IU/L   AST 13 0 - 40 IU/L   ALT 11 0 - 32 IU/L  Lipid panel  Result Value Ref Range   Cholesterol, Total 134 100 - 199 mg/dL   Triglycerides 784 (H) 0 - 149 mg/dL   HDL 32 (L) >69 mg/dL   VLDL Cholesterol Cal 41 (H) 5 - 40 mg/dL   LDL Chol Calc (NIH) 61 0 - 99 mg/dL   Chol/HDL Ratio 4.2 0.0 - 4.4 ratio  TSH  Result Value Ref Range   TSH 2.000 0.450 - 4.500 uIU/mL  Urinalysis, Routine w reflex microscopic  Result Value Ref Range   Specific Gravity, UA 1.020 1.005 -  1.030   pH, UA 7.0 5.0 - 7.5   Color, UA Yellow Yellow   Appearance Ur Clear Clear   Leukocytes,UA Trace (A) Negative   Protein,UA Negative Negative/Trace   Glucose, UA Negative Negative   Ketones, UA Negative Negative    RBC, UA 1+ (A) Negative   Bilirubin, UA Negative Negative   Urobilinogen, Ur 0.2 0.2 - 1.0 mg/dL   Nitrite, UA Negative Negative   Microscopic Examination See below:   HIV Antibody (routine testing w rflx)  Result Value Ref Range   HIV Screen 4th Generation wRfx Non Reactive Non Reactive  Hepatitis C Antibody  Result Value Ref Range   Hep C Virus Ab Non Reactive Non Reactive  Vitamin D (25 hydroxy)  Result Value Ref Range   Vit D, 25-Hydroxy 43.2 30.0 - 100.0 ng/mL      Assessment & Plan:   Problem List Items Addressed This Visit       Other   Menstrual bleeding problem    Improved with Wellbutin but feels like it could be better.  Will increase Wellbutrin to 300mg  daily.  Follow up in 1 month.  Call sooner if concerns arise.       BMI 27.0-27.9,adult - Primary    Chronic.  Would like to start weight loss medication.  Will start Wegovy 0.25mg  weekly.  Side effects and benefits discussed.  Discussed how to titrate medication.  Discussed how to inject medication.  Follow up in 1 month.  Call sooner if concerns arise.         Follow up plan: Return in about 1 month (around 12/05/2023) for Depression/Anxiety FU, Weight Managment.

## 2023-11-06 NOTE — Telephone Encounter (Signed)
Requested medications are due for refill today.  See note  Requested medications are on the active medications list.  yes  Last refill. Ordered 11/05/2023  Future visit scheduled.   yes  Notes to clinic.  Alternative requested - not covered.    Requested Prescriptions  Pending Prescriptions Disp Refills   WEGOVY 0.5 MG/0.5ML SOAJ [Pharmacy Med Name: WEGOVY 0.5 MG/0.5 ML PEN]  0    Sig: INJECT 0.5 MG INTO THE SKIN ONE TIME PER WEEK     Endocrinology:  Diabetes - GLP-1 Receptor Agonists - semaglutide Failed - 11/05/2023  3:48 PM      Failed - HBA1C in normal range and within 180 days    No results found for: "HGBA1C", "LABA1C"       Passed - Cr in normal range and within 360 days    Creatinine, Ser  Date Value Ref Range Status  06/13/2023 0.75 0.57 - 1.00 mg/dL Final         Passed - Valid encounter within last 6 months    Recent Outpatient Visits           Yesterday BMI 27.0-27.9,adult   [place] [facility] Larae Grooms, NP   4 months ago Annual physical exam   [place] [facility] Larae Grooms, NP   5 months ago Menstrual bleeding problem   [place] [facility] Larae Grooms, NP       Future Appointments             In 4 weeks Larae Grooms, NP [facility]

## 2023-11-30 ENCOUNTER — Ambulatory Visit: Payer: Managed Care, Other (non HMO) | Admitting: Nurse Practitioner

## 2023-12-02 ENCOUNTER — Other Ambulatory Visit: Payer: Self-pay | Admitting: Nurse Practitioner

## 2023-12-04 ENCOUNTER — Encounter: Payer: Self-pay | Admitting: Nurse Practitioner

## 2023-12-04 ENCOUNTER — Ambulatory Visit: Payer: Managed Care, Other (non HMO) | Admitting: Nurse Practitioner

## 2023-12-04 VITALS — BP 107/72 | HR 83 | Temp 98.1°F | Ht 65.0 in | Wt 160.4 lb

## 2023-12-04 DIAGNOSIS — T148XXA Other injury of unspecified body region, initial encounter: Secondary | ICD-10-CM | POA: Diagnosis not present

## 2023-12-04 DIAGNOSIS — Z6827 Body mass index (BMI) 27.0-27.9, adult: Secondary | ICD-10-CM | POA: Diagnosis not present

## 2023-12-04 MED ORDER — METHYLPREDNISOLONE 4 MG PO TBPK: ORAL_TABLET | ORAL | 0 refills | Status: DC

## 2023-12-04 MED ORDER — NALTREXONE HCL 50 MG PO TABS: 25.0000 mg | ORAL_TABLET | Freq: Every day | ORAL | 0 refills | Status: DC

## 2023-12-04 MED ORDER — TIZANIDINE HCL 4 MG PO TABS
4.0000 mg | ORAL_TABLET | Freq: Four times a day (QID) | ORAL | 0 refills | Status: DC | PRN
Start: 2023-12-04 — End: 2024-04-07

## 2023-12-04 NOTE — Progress Notes (Unsigned)
BP 107/72 (BP Location: Left Arm, Patient Position: Sitting, Cuff Size: Normal)   Temp 98.1 F (36.7 C) (Oral)   Ht 5\' 5"  (1.651 m)   Wt 160 lb 6.4 oz (72.8 kg)   BMI 26.69 kg/m    Subjective:    Patient ID: Marissa Harrington, female    DOB: 18-May-2001, 22 y.o.   MRN: [id number]  HPI: Marissa Harrington is a 22 y.o. female  Chief Complaint  Patient presents with   Back Pain    Pain has been ongoing since Thursday, Pain located in the upper back near shoulder blades, prior treatment ibuprofen    BACK PAIN Duration:  5 days Mechanism of injury: lifting Location: Left and midline Onset: sudden Severity:  thurs-sat it was 10 but is is improving now Quality: sharp Frequency: intermittent Radiation: none Aggravating factors: lifting and movement Alleviating factors: NSAIDs Status: better Treatments attempted: heat and ibuprofen  Relief with NSAIDs?: moderate Nighttime pain:  no Paresthesias / decreased sensation:  no Bowel / bladder incontinence:  no Fevers:  no Dysuria / urinary frequency:  no  WEIGHT GAIN Duration:  Previous attempts at weight loss: {Blank single:19197::"yes","no"} Complications of obesity:  Peak weight:  Weight loss goal:  Weight loss to date:  Requesting obesity pharmacotherapy: {Blank single:19197::"yes","no"} Current weight loss supplements/medications: {Blank single:19197::"yes","no"} Previous weight loss supplements/meds: {Blank single:19197::"yes","no"} Calories:    Relevant past medical, surgical, family and social history reviewed and updated as indicated. Interim medical history since our last visit reviewed. Allergies and medications reviewed and updated.  Review of Systems  Per HPI unless specifically indicated above     Objective:    BP 107/72 (BP Location: Left Arm, Patient Position: Sitting, Cuff Size: Normal)   Temp 98.1 F (36.7 C) (Oral)   Ht 5\' 5"  (1.651 m)   Wt 160 lb 6.4 oz (72.8 kg)   BMI 26.69 kg/m   Wt  Readings from Last 3 Encounters:  12/04/23 160 lb 6.4 oz (72.8 kg)  11/05/23 162 lb 3.2 oz (73.6 kg)  06/13/23 166 lb 3.2 oz (75.4 kg)    Physical Exam  Results for orders placed or performed in visit on 06/13/23  GC/Chlamydia Probe Amp   Specimen: Urine   UR  Result Value Ref Range   Chlamydia trachomatis, NAA Negative Negative   Neisseria Gonorrhoeae by PCR Negative Negative  Microscopic Examination   Urine  Result Value Ref Range   WBC, UA 0-5 0 - 5 /hpf   RBC, Urine 0-2 0 - 2 /hpf   Epithelial Cells (non renal) 0-10 0 - 10 /hpf   Bacteria, UA None seen None seen/Few  CBC with Differential/Platelet  Result Value Ref Range   WBC 11.0 (H) 3.4 - 10.8 x10E3/uL   RBC 4.59 3.77 - 5.28 x10E6/uL   Hemoglobin 11.4 11.1 - 15.9 g/dL   Hematocrit 78.2 95.6 - 46.6 %   MCV 78 (L) 79 - 97 fL   MCH 24.8 (L) 26.6 - 33.0 pg   MCHC 31.9 31.5 - 35.7 g/dL   RDW 21.3 08.6 - 57.8 %   Platelets 334 150 - 450 x10E3/uL   Neutrophils 52 Not Estab. %   Lymphs 41 Not Estab. %   Monocytes 5 Not Estab. %   Eos 1 Not Estab. %   Basos 1 Not Estab. %   Neutrophils Absolute 5.8 1.4 - 7.0 x10E3/uL   Lymphocytes Absolute 4.5 (H) 0.7 - 3.1 x10E3/uL   Monocytes Absolute 0.5 0.1 - 0.9 x10E3/uL  EOS (ABSOLUTE) 0.1 0.0 - 0.4 x10E3/uL   Basophils Absolute 0.1 0.0 - 0.2 x10E3/uL   Immature Granulocytes 0 Not Estab. %   Immature Grans (Abs) 0.0 0.0 - 0.1 x10E3/uL  Comprehensive metabolic panel  Result Value Ref Range   Glucose 80 70 - 99 mg/dL   BUN 10 6 - 20 mg/dL   Creatinine, Ser 1.61 0.57 - 1.00 mg/dL   eGFR 096 >04 VW/UJW/1.19   BUN/Creatinine Ratio 13 9 - 23   Sodium 139 134 - 144 mmol/L   Potassium 3.9 3.5 - 5.2 mmol/L   Chloride 98 96 - 106 mmol/L   CO2 26 20 - 29 mmol/L   Calcium 9.8 8.7 - 10.2 mg/dL   Total Protein 7.1 6.0 - 8.5 g/dL   Albumin 4.3 4.0 - 5.0 g/dL   Globulin, Total 2.8 1.5 - 4.5 g/dL   Bilirubin Total 0.4 0.0 - 1.2 mg/dL   Alkaline Phosphatase 81 44 - 121 IU/L   AST 13 0  - 40 IU/L   ALT 11 0 - 32 IU/L  Lipid panel  Result Value Ref Range   Cholesterol, Total 134 100 - 199 mg/dL   Triglycerides 147 (H) 0 - 149 mg/dL   HDL 32 (L) >82 mg/dL   VLDL Cholesterol Cal 41 (H) 5 - 40 mg/dL   LDL Chol Calc (NIH) 61 0 - 99 mg/dL   Chol/HDL Ratio 4.2 0.0 - 4.4 ratio  TSH  Result Value Ref Range   TSH 2.000 0.450 - 4.500 uIU/mL  Urinalysis, Routine w reflex microscopic  Result Value Ref Range   Specific Gravity, UA 1.020 1.005 - 1.030   pH, UA 7.0 5.0 - 7.5   Color, UA Yellow Yellow   Appearance Ur Clear Clear   Leukocytes,UA Trace (A) Negative   Protein,UA Negative Negative/Trace   Glucose, UA Negative Negative   Ketones, UA Negative Negative   RBC, UA 1+ (A) Negative   Bilirubin, UA Negative Negative   Urobilinogen, Ur 0.2 0.2 - 1.0 mg/dL   Nitrite, UA Negative Negative   Microscopic Examination See below:   HIV Antibody (routine testing w rflx)  Result Value Ref Range   HIV Screen 4th Generation wRfx Non Reactive Non Reactive  Hepatitis C Antibody  Result Value Ref Range   Hep C Virus Ab Non Reactive Non Reactive  Vitamin D (25 hydroxy)  Result Value Ref Range   Vit D, 25-Hydroxy 43.2 30.0 - 100.0 ng/mL      Assessment & Plan:   Problem List Items Addressed This Visit   None    Follow up plan: No follow-ups on file.

## 2023-12-04 NOTE — Telephone Encounter (Signed)
Requested medication (s) are due for refill today: Yes  Requested medication (s) are on the active medication list: Yes  Last refill:  11/05/23  Future visit scheduled: Yes, today  Notes to clinic:  Month supply given as a trail     Requested Prescriptions  Pending Prescriptions Disp Refills   buPROPion (WELLBUTRIN XL) 300 MG 24 hr tablet [Pharmacy Med Name: BUPROPION HCL XL 300 MG TABLET] 90 tablet 1    Sig: TAKE 1 TABLET BY MOUTH EVERY DAY     Psychiatry: Antidepressants - bupropion Passed - 12/02/2023 11:30 AM      Passed - Cr in normal range and within 360 days    Creatinine, Ser  Date Value Ref Range Status  06/13/2023 0.75 0.57 - 1.00 mg/dL Final         Passed - AST in normal range and within 360 days    AST  Date Value Ref Range Status  06/13/2023 13 0 - 40 IU/L Final         Passed - ALT in normal range and within 360 days    ALT  Date Value Ref Range Status  06/13/2023 11 0 - 32 IU/L Final         Passed - Last BP in normal range    BP Readings from Last 1 Encounters:  11/05/23 107/69         Passed - Valid encounter within last 6 months    Recent Outpatient Visits           4 weeks ago BMI 27.0-27.9,adult   [place] [facility] Larae Grooms, NP   5 months ago Annual physical exam   [facility] Larae Grooms, NP   6 months ago Menstrual bleeding problem   [place] [facility] Larae Grooms, NP       Future Appointments             Today Larae Grooms, NP [facility]   In 2 weeks Larae Grooms, NP [place]

## 2023-12-05 ENCOUNTER — Encounter: Payer: Self-pay | Admitting: Nurse Practitioner

## 2023-12-05 ENCOUNTER — Ambulatory Visit: Payer: Managed Care, Other (non HMO) | Admitting: Nurse Practitioner

## 2023-12-05 NOTE — Assessment & Plan Note (Signed)
Chronic.  Patient already on wellbutrin 300mg  and tolerating it well.  Will add Naltrexone 25mg  daily to aid in weight loss.  Side effects and benefits of medication discussed during visit.  Follow up in 3 weeks.  Call sooner if concerns arise.

## 2023-12-13 ENCOUNTER — Encounter: Payer: Self-pay | Admitting: Nurse Practitioner

## 2023-12-24 ENCOUNTER — Encounter: Payer: Self-pay | Admitting: Nurse Practitioner

## 2023-12-24 ENCOUNTER — Ambulatory Visit: Payer: Managed Care, Other (non HMO) | Admitting: Nurse Practitioner

## 2023-12-24 VITALS — BP 108/71 | HR 125 | Temp 97.9°F | Ht 66.0 in | Wt 155.2 lb

## 2023-12-24 DIAGNOSIS — Z6827 Body mass index (BMI) 27.0-27.9, adult: Secondary | ICD-10-CM

## 2023-12-24 MED ORDER — NALTREXONE HCL 50 MG PO TABS: 25.0000 mg | ORAL_TABLET | Freq: Every day | ORAL | 0 refills | Status: DC

## 2023-12-24 NOTE — Assessment & Plan Note (Signed)
Chronic. Currently on Naltrexone 25mg  daily.  Doing well with current dose.  Has lost 5lbs since starting medication earlier this month.  Continue with current medication regimen.  Follow up in 2 months.  Call sooner if concerns arise.

## 2023-12-24 NOTE — Progress Notes (Signed)
BP 108/71 (BP Location: Left Arm, Patient Position: Sitting, Cuff Size: Large)   Pulse (!) 125   Temp 97.9 F (36.6 C) (Oral)   Ht 5\' 6"  (1.676 m)   Wt 155 lb 3.2 oz (70.4 kg)   SpO2 99%   BMI 25.05 kg/m    Subjective:    Patient ID: Marissa Harrington, female    DOB: 2001/02/06, 22 y.o.   MRN: [id number]  HPI: Marissa Harrington is a 22 y.o. female  Chief Complaint  Patient presents with   Depression   Anxiety   WEIGHT GAIN Duration: months Previous attempts at weight loss: yes Complications of obesity:  Peak weight: 160lb Weight loss goal: 140lb Weight loss to date:  Requesting obesity pharmacotherapy: yes Current weight loss supplements/medications: no Previous weight loss supplements/meds: no Calories:     Relevant past medical, surgical, family and social history reviewed and updated as indicated. Interim medical history since our last visit reviewed. Allergies and medications reviewed and updated.  Review of Systems  Constitutional:  Negative for unexpected weight change.  Musculoskeletal:  Positive for back pain.    Per HPI unless specifically indicated above     Objective:    BP 108/71 (BP Location: Left Arm, Patient Position: Sitting, Cuff Size: Large)   Pulse (!) 125   Temp 97.9 F (36.6 C) (Oral)   Ht 5\' 6"  (1.676 m)   Wt 155 lb 3.2 oz (70.4 kg)   SpO2 99%   BMI 25.05 kg/m   Wt Readings from Last 3 Encounters:  12/24/23 155 lb 3.2 oz (70.4 kg)  12/04/23 160 lb 6.4 oz (72.8 kg)  11/05/23 162 lb 3.2 oz (73.6 kg)    Physical Exam Vitals and nursing note reviewed.  Constitutional:      General: She is not in acute distress.    Appearance: Normal appearance. She is normal weight. She is not ill-appearing, toxic-appearing or diaphoretic.  HENT:     Head: Normocephalic.     Right Ear: External ear normal.     Left Ear: External ear normal.     Nose: Nose normal.     Mouth/Throat:     Mouth: Mucous membranes are moist.     Pharynx:  Oropharynx is clear.  Eyes:     General:        Right eye: No discharge.        Left eye: No discharge.     Extraocular Movements: Extraocular movements intact.     Conjunctiva/sclera: Conjunctivae normal.     Pupils: Pupils are equal, round, and reactive to light.  Cardiovascular:     Rate and Rhythm: Normal rate and regular rhythm.     Heart sounds: No murmur heard. Pulmonary:     Effort: Pulmonary effort is normal. No respiratory distress.     Breath sounds: Normal breath sounds. No wheezing or rales.  Musculoskeletal:     Cervical back: Normal range of motion and neck supple.     Thoracic back: Spasms and tenderness present.  Skin:    General: Skin is warm and dry.     Capillary Refill: Capillary refill takes less than 2 seconds.  Neurological:     General: No focal deficit present.     Mental Status: She is alert and oriented to person, place, and time. Mental status is at baseline.  Psychiatric:        Mood and Affect: Mood normal.        Behavior: Behavior normal.  Thought Content: Thought content normal.        Judgment: Judgment normal.     Results for orders placed or performed in visit on 06/13/23  GC/Chlamydia Probe Amp   Collection Time: 06/13/23  3:27 PM   Specimen: Urine   UR  Result Value Ref Range   Chlamydia trachomatis, NAA Negative Negative   Neisseria Gonorrhoeae by PCR Negative Negative  Microscopic Examination   Collection Time: 06/13/23  3:27 PM   Urine  Result Value Ref Range   WBC, UA 0-5 0 - 5 /hpf   RBC, Urine 0-2 0 - 2 /hpf   Epithelial Cells (non renal) 0-10 0 - 10 /hpf   Bacteria, UA None seen None seen/Few  Urinalysis, Routine w reflex microscopic   Collection Time: 06/13/23  3:27 PM  Result Value Ref Range   Specific Gravity, UA 1.020 1.005 - 1.030   pH, UA 7.0 5.0 - 7.5   Color, UA Yellow Yellow   Appearance Ur Clear Clear   Leukocytes,UA Trace (A) Negative   Protein,UA Negative Negative/Trace   Glucose, UA Negative  Negative   Ketones, UA Negative Negative   RBC, UA 1+ (A) Negative   Bilirubin, UA Negative Negative   Urobilinogen, Ur 0.2 0.2 - 1.0 mg/dL   Nitrite, UA Negative Negative   Microscopic Examination See below:   CBC with Differential/Platelet   Collection Time: 06/13/23  3:33 PM  Result Value Ref Range   WBC 11.0 (H) 3.4 - 10.8 x10E3/uL   RBC 4.59 3.77 - 5.28 x10E6/uL   Hemoglobin 11.4 11.1 - 15.9 g/dL   Hematocrit 16.1 09.6 - 46.6 %   MCV 78 (L) 79 - 97 fL   MCH 24.8 (L) 26.6 - 33.0 pg   MCHC 31.9 31.5 - 35.7 g/dL   RDW 04.5 40.9 - 81.1 %   Platelets 334 150 - 450 x10E3/uL   Neutrophils 52 Not Estab. %   Lymphs 41 Not Estab. %   Monocytes 5 Not Estab. %   Eos 1 Not Estab. %   Basos 1 Not Estab. %   Neutrophils Absolute 5.8 1.4 - 7.0 x10E3/uL   Lymphocytes Absolute 4.5 (H) 0.7 - 3.1 x10E3/uL   Monocytes Absolute 0.5 0.1 - 0.9 x10E3/uL   EOS (ABSOLUTE) 0.1 0.0 - 0.4 x10E3/uL   Basophils Absolute 0.1 0.0 - 0.2 x10E3/uL   Immature Granulocytes 0 Not Estab. %   Immature Grans (Abs) 0.0 0.0 - 0.1 x10E3/uL  Comprehensive metabolic panel   Collection Time: 06/13/23  3:33 PM  Result Value Ref Range   Glucose 80 70 - 99 mg/dL   BUN 10 6 - 20 mg/dL   Creatinine, Ser 9.14 0.57 - 1.00 mg/dL   eGFR 782 >95 AO/ZHY/8.65   BUN/Creatinine Ratio 13 9 - 23   Sodium 139 134 - 144 mmol/L   Potassium 3.9 3.5 - 5.2 mmol/L   Chloride 98 96 - 106 mmol/L   CO2 26 20 - 29 mmol/L   Calcium 9.8 8.7 - 10.2 mg/dL   Total Protein 7.1 6.0 - 8.5 g/dL   Albumin 4.3 4.0 - 5.0 g/dL   Globulin, Total 2.8 1.5 - 4.5 g/dL   Bilirubin Total 0.4 0.0 - 1.2 mg/dL   Alkaline Phosphatase 81 44 - 121 IU/L   AST 13 0 - 40 IU/L   ALT 11 0 - 32 IU/L  Lipid panel   Collection Time: 06/13/23  3:33 PM  Result Value Ref Range   Cholesterol, Total 134 100 -  199 mg/dL   Triglycerides 409 (H) 0 - 149 mg/dL   HDL 32 (L) >81 mg/dL   VLDL Cholesterol Cal 41 (H) 5 - 40 mg/dL   LDL Chol Calc (NIH) 61 0 - 99 mg/dL    Chol/HDL Ratio 4.2 0.0 - 4.4 ratio  TSH   Collection Time: 06/13/23  3:33 PM  Result Value Ref Range   TSH 2.000 0.450 - 4.500 uIU/mL  HIV Antibody (routine testing w rflx)   Collection Time: 06/13/23  3:33 PM  Result Value Ref Range   HIV Screen 4th Generation wRfx Non Reactive Non Reactive  Hepatitis C Antibody   Collection Time: 06/13/23  3:33 PM  Result Value Ref Range   Hep C Virus Ab Non Reactive Non Reactive  Vitamin D (25 hydroxy)   Collection Time: 06/13/23  3:33 PM  Result Value Ref Range   Vit D, 25-Hydroxy 43.2 30.0 - 100.0 ng/mL      Assessment & Plan:   Problem List Items Addressed This Visit       Other   BMI 27.0-27.9,adult - Primary   Chronic. Currently on Naltrexone 25mg  daily.  Doing well with current dose.  Has lost 5lbs since starting medication earlier this month.  Continue with current medication regimen.  Follow up in 2 months.  Call sooner if concerns arise.         Follow up plan: Return in about 2 months (around 02/22/2024) for Medication Management.

## 2024-01-01 ENCOUNTER — Encounter: Payer: Self-pay | Admitting: Nurse Practitioner

## 2024-01-02 MED ORDER — BUPROPION HCL ER (XL) 300 MG PO TB24: 300.0000 mg | ORAL_TABLET | Freq: Every day | ORAL | 1 refills | Status: DC

## 2024-01-11 ENCOUNTER — Encounter: Payer: Self-pay | Admitting: Nurse Practitioner

## 2024-01-23 ENCOUNTER — Encounter: Payer: Self-pay | Admitting: Nurse Practitioner

## 2024-02-18 ENCOUNTER — Encounter: Payer: Self-pay | Admitting: Nurse Practitioner

## 2024-02-19 MED ORDER — ONDANSETRON HCL 4 MG PO TABS: 4.0000 mg | ORAL_TABLET | Freq: Three times a day (TID) | ORAL | 1 refills | Status: DC | PRN

## 2024-02-21 ENCOUNTER — Ambulatory Visit: Payer: Self-pay | Admitting: Nurse Practitioner

## 2024-03-10 ENCOUNTER — Ambulatory Visit
Admission: RE | Admit: 2024-03-10 | Discharge: 2024-03-10 | Disposition: A | Discharge status: Home / Self Care | Payer: Self-pay | Source: Ambulatory Visit | Attending: Emergency Medicine | Admitting: Emergency Medicine

## 2024-03-10 VITALS — BP 112/70 | HR 88 | Temp 98.3°F | Resp 16 | Wt 155.0 lb

## 2024-03-10 DIAGNOSIS — J069 Acute upper respiratory infection, unspecified: Secondary | ICD-10-CM

## 2024-03-10 LAB — GROUP A STREP BY PCR: Group A Strep by PCR: NOT DETECTED

## 2024-03-10 MED ORDER — PROMETHAZINE-DM 6.25-15 MG/5ML PO SYRP: 5.0000 mL | ORAL_SOLUTION | Freq: Four times a day (QID) | ORAL | 0 refills | Status: DC | PRN

## 2024-03-10 MED ORDER — BENZONATATE 100 MG PO CAPS: 200.0000 mg | ORAL_CAPSULE | Freq: Three times a day (TID) | ORAL | 0 refills | Status: DC

## 2024-03-10 MED ORDER — IPRATROPIUM BROMIDE 0.06 % NA SOLN: 2.0000 | Freq: Four times a day (QID) | NASAL | 12 refills | Status: DC

## 2024-03-10 NOTE — ED Triage Notes (Signed)
 Pt presents with a cough, nasal congestion, sinus pressure, headache and bilateral ear pain x 5 days. Pt has taken OTC cold medication for her symptoms.

## 2024-03-10 NOTE — Discharge Instructions (Addendum)
 Your strep test today was negative.  Based upon your physical exam I do believe you have a viral respiratory illness which is causing your symptoms.  Use over-the-counter Tylenol and/or ibuprofen according the package instructions as needed for any fever or pain.  Use the Atrovent nasal spray, 2 squirts in each nostril every 6 hours, as needed for runny nose and postnasal drip.  Use the Tessalon Perles every 8 hours during the day.  Take them with a small sip of water.  They may give you some numbness to the base of your tongue or a metallic taste in your mouth, this is normal.  Use the Promethazine DM cough syrup at bedtime for cough and congestion.  It will make you drowsy so do not take it during the day.  Return for reevaluation or see your primary care provider for any new or worsening symptoms.

## 2024-03-10 NOTE — ED Provider Notes (Signed)
 [facility]    CSN: [id number] Arrival date & time: 03/10/24  1800      History   Chief Complaint Chief Complaint  Patient presents with   sinus pressure    Headache   Otalgia   Cough   Nasal Congestion    HPI Marissa Harrington is a 23 y.o. female.   HPI  23 year old female with past medical history significant for anxiety and acid reflux presents for evaluation of 5 days worth of respiratory symptoms which include nasal congestion, sinus pressure, headache, bilateral ear pain, and a nonproductive cough.  She denies any fever, shortness breath, wheezing, or GI complaints.  She is a Manufacturing systems engineer.  Past Medical History:  Diagnosis Date   Acid reflux    Anxiety     Patient Active Problem List   Diagnosis Date Noted   Menstrual bleeding problem 05/17/2023   BMI 27.0-27.9,adult 05/17/2023    History reviewed. No pertinent surgical history.  OB History   No obstetric history on file.      Home Medications    Prior to Admission medications   Medication Sig Start Date End Date Taking? Authorizing Provider  benzonatate (TESSALON) 100 MG capsule Take 2 capsules (200 mg total) by mouth every 8 (eight) hours. 03/10/24  Yes Becky Augusta, NP  buPROPion (WELLBUTRIN XL) 300 MG 24 hr tablet Take 1 tablet (300 mg total) by mouth daily. 01/02/24  Yes Larae Grooms, NP  ipratropium (ATROVENT) 0.06 % nasal spray Place 2 sprays into both nostrils 4 (four) times daily. 03/10/24  Yes Becky Augusta, NP  ondansetron (ZOFRAN) 4 MG tablet Take 1 tablet (4 mg total) by mouth every 8 (eight) hours as needed for nausea or vomiting. 02/19/24  Yes Larae Grooms, NP  promethazine-dextromethorphan (PROMETHAZINE-DM) 6.25-15 MG/5ML syrup Take 5 mLs by mouth 4 (four) times daily as needed. 03/10/24  Yes Becky Augusta, NP  tiZANidine (ZANAFLEX) 4 MG tablet Take 1 tablet (4 mg total) by mouth every 6 (six) hours as needed for muscle spasms. 12/04/23   Larae Grooms, NP   dicyclomine (BENTYL) 10 MG capsule Take by mouth. 07/28/20 03/08/21  [provider]  escitalopram (LEXAPRO) 5 MG tablet Take 5 mg by mouth daily. 07/29/20 03/08/21  [provider]  sucralfate (CARAFATE) 1 GM/10ML suspension Take by mouth. 07/05/20 03/08/21  [provider]    Family History Family History  Problem Relation Age of Onset   Healthy Mother    Healthy Father     Social History Social History   Tobacco Use   Smoking status: Never   Smokeless tobacco: Never  Vaping Use   Vaping status: Never Used  Substance Use Topics   Alcohol use: Never   Drug use: Never     Allergies   Patient has no known allergies.   Review of Systems Review of Systems  Constitutional:  Negative for fever.  HENT:  Positive for congestion, ear pain, rhinorrhea, sinus pressure and sore throat.   Respiratory:  Positive for cough. Negative for shortness of breath and wheezing.   Gastrointestinal:  Negative for diarrhea, nausea and vomiting.     Physical Exam Triage Vital Signs ED Triage Vitals  Encounter Vitals Group     BP 03/10/24 1812 112/70     Systolic BP Percentile --      Diastolic BP Percentile --      Pulse Rate 03/10/24 1812 88     Resp 03/10/24 1812 16     Temp 03/10/24 1812  98.3 F (36.8 C)     Temp Source 03/10/24 1812 Oral     SpO2 03/10/24 1812 100 %     Weight 03/10/24 1811 155 lb (70.3 kg)     Height --      Head Circumference --      Peak Flow --      Pain Score 03/10/24 1810 0     Pain Loc --      Pain Education --      Exclude from Growth Chart --    No data found.  Updated Vital Signs BP 112/70 (BP Location: Left Arm)   Pulse 88   Temp 98.3 F (36.8 C) (Oral)   Resp 16   Wt 155 lb (70.3 kg)   LMP 02/25/2024 (Approximate)   SpO2 100%   BMI 25.02 kg/m   Visual Acuity Right Eye Distance:   Left Eye Distance:   Bilateral Distance:    Right Eye Near:   Left Eye Near:    Bilateral Near:     Physical Exam Vitals and  nursing note reviewed.  Constitutional:      Appearance: Normal appearance. She is not ill-appearing.  HENT:     Head: Normocephalic and atraumatic.     Right Ear: Tympanic membrane, ear canal and external ear normal. There is no impacted cerumen.     Left Ear: Tympanic membrane, ear canal and external ear normal. There is no impacted cerumen.     Nose: Congestion and rhinorrhea present.     Comments: Nasal mucosa is edematous and erythematous with clear discharge in both nares.    Mouth/Throat:     Mouth: Mucous membranes are moist.     Pharynx: Oropharyngeal exudate and posterior oropharyngeal erythema present.     Comments: Tonsillar pillars are erythematous and 2+ edematous with white exudate. Cardiovascular:     Rate and Rhythm: Normal rate and regular rhythm.     Pulses: Normal pulses.     Heart sounds: Normal heart sounds. No murmur heard.    No friction rub. No gallop.  Pulmonary:     Effort: Pulmonary effort is normal.     Breath sounds: Normal breath sounds. No wheezing, rhonchi or rales.  Musculoskeletal:     Cervical back: Normal range of motion and neck supple. No tenderness.  Lymphadenopathy:     Cervical: No cervical adenopathy.  Skin:    General: Skin is warm and dry.     Capillary Refill: Capillary refill takes less than 2 seconds.     Findings: No rash.  Neurological:     General: No focal deficit present.     Mental Status: She is alert and oriented to person, place, and time.      UC Treatments / Results  Labs (all labs ordered are listed, but only abnormal results are displayed) Labs Reviewed  GROUP A STREP BY PCR    EKG   Radiology No results found.  Procedures Procedures (including critical care time)  Medications Ordered in UC Medications - No data to display  Initial Impression / Assessment and Plan / UC Course  I have reviewed the triage vital signs and the nursing notes.  Pertinent labs & imaging results that were available during  my care of the patient were reviewed by me and considered in my medical decision making (see chart for details).   Patient is a nontoxic-appearing 23 year old female presenting for evaluation of 5 days with the respiratory symptoms as outlined HPI above.  She  reports that her symptoms began with a sore throat but she reports that her sore throat has largely resolved.  However, she does have erythematous and edematous tonsillar pillars with white exudate present.  The rest of her physical exam reveals inflammation of her nasal mucosa with clear nasal discharge.  Bilateral tympanic membranes are pearly gray in appearance with normal light reflex and clear external auditory canals.  No cervical lymphadenopathy present.  Cardiopulmonary exam reveals good lung sounds in all fields.  Differential diagnosis include COVID, influenza, strep pharyngitis, viral respiratory illness.  Given that she is on day 5 of symptoms I will not test her for COVID or influenza at this time.  I will order a strep PCR however.  Strep PCR is negative.  I will discharge patient home with a diagnosis of viral URI with a cough and have her use over-the-counter Tylenol and/or ibuprofen as needed for any fever or pain.  Atrovent Nasabid help with nasal congestion.  Tessalon Perles and Promethazine DM cough syrup for cough and congestion.   Final Clinical Impressions(s) / UC Diagnoses   Final diagnoses:  Viral URI with cough     Discharge Instructions      Your strep test today was negative.  Based upon your physical exam I do believe you have a viral respiratory illness which is causing your symptoms.  Use over-the-counter Tylenol and/or ibuprofen according the package instructions as needed for any fever or pain.  Use the Atrovent nasal spray, 2 squirts in each nostril every 6 hours, as needed for runny nose and postnasal drip.  Use the Tessalon Perles every 8 hours during the day.  Take them with a small sip of water.   They may give you some numbness to the base of your tongue or a metallic taste in your mouth, this is normal.  Use the Promethazine DM cough syrup at bedtime for cough and congestion.  It will make you drowsy so do not take it during the day.  Return for reevaluation or see your primary care provider for any new or worsening symptoms.      ED Prescriptions     Medication Sig Dispense Auth. Provider   benzonatate (TESSALON) 100 MG capsule Take 2 capsules (200 mg total) by mouth every 8 (eight) hours. 21 capsule Becky Augusta, NP   ipratropium (ATROVENT) 0.06 % nasal spray Place 2 sprays into both nostrils 4 (four) times daily. 15 mL Becky Augusta, NP   promethazine-dextromethorphan (PROMETHAZINE-DM) 6.25-15 MG/5ML syrup Take 5 mLs by mouth 4 (four) times daily as needed. 118 mL Becky Augusta, NP      PDMP not reviewed this encounter.   Becky Augusta, NP 03/10/24 1909

## 2024-03-11 ENCOUNTER — Encounter: Payer: Self-pay | Admitting: Nurse Practitioner

## 2024-03-12 ENCOUNTER — Ambulatory Visit: Admitting: Nurse Practitioner

## 2024-03-12 ENCOUNTER — Encounter: Payer: Self-pay | Admitting: Nurse Practitioner

## 2024-03-12 VITALS — BP 107/74 | HR 79 | Temp 98.0°F

## 2024-03-12 DIAGNOSIS — J0110 Acute frontal sinusitis, unspecified: Secondary | ICD-10-CM

## 2024-03-12 MED ORDER — AMOXICILLIN 500 MG PO CAPS: 500.0000 mg | ORAL_CAPSULE | Freq: Two times a day (BID) | ORAL | 0 refills | Status: AC

## 2024-03-12 NOTE — Progress Notes (Signed)
 BP 107/74 (BP Location: Left Arm, Patient Position: Sitting, Cuff Size: Large)   Pulse 79   Temp 98 F (36.7 C) (Oral)   LMP 02/25/2024 (Approximate)   SpO2 97%    Subjective:    Patient ID: Marissa Harrington, female    DOB: Mar 05, 2001, 23 y.o.   MRN: [id number]  HPI: Marissa Harrington is a 23 y.o. female  Chief Complaint  Patient presents with   Sinusitis   Pressure Behind the Eyes    And nose area   Sore Throat    Has been tested and test came back negative, would like tonsils checked. Was last checked on monday   UPPER RESPIRATORY TRACT INFECTION Worst symptom: symptoms started last Wednesday. Seen in [facility] on Monday and had a strep test done and given cough medication and nasal spray.   Fever: no Cough: yes Shortness of breath: yes Wheezing: no Chest pain: no Chest tightness: no Chest congestion: no Nasal congestion: yes Runny nose: no Post nasal drip: no Sneezing: yes Sore throat: yes Swollen glands: no Sinus pressure: yes Headache: yes Face pain: yes Toothache: yes Ear pain: yes bilateral Ear pressure: yes bilateral Eyes red/itching:no Eye drainage/crusting: no  Vomiting: no Rash: no Fatigue: yes Sick contacts: no Strep contacts: no  Context: worse Recurrent sinusitis: no Relief with OTC cold/cough medications: yes  Treatments attempted: cold/sinus, mucinex, and pseudoephedrine   Relevant past medical, surgical, family and social history reviewed and updated as indicated. Interim medical history since our last visit reviewed. Allergies and medications reviewed and updated.  Review of Systems  Constitutional:  Positive for fatigue. Negative for fever.  HENT:  Positive for congestion, dental problem, ear pain, postnasal drip, sinus pressure, sinus pain, sneezing and sore throat. Negative for rhinorrhea.   Respiratory:  Positive for cough and shortness of breath. Negative for wheezing.   Cardiovascular:  Negative for chest pain.  Gastrointestinal:   Negative for vomiting.  Skin:  Negative for rash.  Neurological:  Positive for headaches.    Per HPI unless specifically indicated above     Objective:    BP 107/74 (BP Location: Left Arm, Patient Position: Sitting, Cuff Size: Large)   Pulse 79   Temp 98 F (36.7 C) (Oral)   LMP 02/25/2024 (Approximate)   SpO2 97%   Wt Readings from Last 3 Encounters:  03/10/24 155 lb (70.3 kg)  12/24/23 155 lb 3.2 oz (70.4 kg)  12/04/23 160 lb 6.4 oz (72.8 kg)    Physical Exam Vitals and nursing note reviewed.  Constitutional:      General: She is not in acute distress.    Appearance: Normal appearance. She is normal weight. She is not ill-appearing, toxic-appearing or diaphoretic.  HENT:     Head: Normocephalic.     Right Ear: Tympanic membrane and external ear normal.     Left Ear: Tympanic membrane and external ear normal.     Nose: Congestion present.     Mouth/Throat:     Mouth: Mucous membranes are moist.     Pharynx: Posterior oropharyngeal erythema present. No oropharyngeal exudate.     Tonsils: 2+ on the right. 2+ on the left.  Eyes:     General:        Right eye: No discharge.        Left eye: No discharge.     Extraocular Movements: Extraocular movements intact.     Conjunctiva/sclera: Conjunctivae normal.     Pupils: Pupils are equal, round, and reactive to  light.  Cardiovascular:     Rate and Rhythm: Normal rate and regular rhythm.     Heart sounds: No murmur heard. Pulmonary:     Effort: Pulmonary effort is normal. No respiratory distress.     Breath sounds: Normal breath sounds. No wheezing or rales.  Musculoskeletal:     Cervical back: Normal range of motion and neck supple.  Skin:    General: Skin is warm and dry.     Capillary Refill: Capillary refill takes less than 2 seconds.  Neurological:     General: No focal deficit present.     Mental Status: She is alert and oriented to person, place, and time. Mental status is at baseline.  Psychiatric:        Mood  and Affect: Mood normal.        Behavior: Behavior normal.        Thought Content: Thought content normal.        Judgment: Judgment normal.     Results for orders placed or performed during the hospital encounter of 03/10/24  Group A Strep by PCR   Collection Time: 03/10/24  6:32 PM   Specimen: Throat; Sterile Swab  Result Value Ref Range   Group A Strep by PCR NOT DETECTED NOT DETECTED      Assessment & Plan:   Problem List Items Addressed This Visit   None Visit Diagnoses       Acute non-recurrent frontal sinusitis    -  Primary   Had 2 neg strep tests. Will treat with Amoxicillin.  Complete course of antibiotics. Continue with OTC symptom management follow up if not improved.   Relevant Medications   amoxicillin (AMOXIL) 500 MG capsule        Follow up plan: Return if symptoms worsen or fail to improve.

## 2024-03-18 ENCOUNTER — Encounter: Payer: Self-pay | Admitting: Nurse Practitioner

## 2024-03-19 MED ORDER — METHYLPREDNISOLONE 4 MG PO TBPK: ORAL_TABLET | ORAL | 0 refills | Status: DC

## 2024-03-21 ENCOUNTER — Encounter: Payer: Self-pay | Admitting: Nurse Practitioner

## 2024-03-24 ENCOUNTER — Ambulatory Visit: Payer: Managed Care, Other (non HMO) | Admitting: Nurse Practitioner

## 2024-03-24 MED ORDER — FLUTICASONE PROPIONATE 50 MCG/ACT NA SUSP: 2.0000 | Freq: Every day | NASAL | 6 refills | Status: AC

## 2024-03-26 ENCOUNTER — Encounter: Payer: Self-pay | Admitting: Nurse Practitioner

## 2024-03-26 DIAGNOSIS — J351 Hypertrophy of tonsils: Secondary | ICD-10-CM

## 2024-03-27 MED ORDER — LIDOCAINE VISCOUS HCL 2 % MT SOLN: 15.0000 mL | OROMUCOSAL | 0 refills | Status: DC | PRN

## 2024-04-07 ENCOUNTER — Ambulatory Visit: Admitting: Nurse Practitioner

## 2024-04-07 ENCOUNTER — Encounter: Payer: Self-pay | Admitting: Nurse Practitioner

## 2024-04-07 VITALS — BP 107/66 | HR 92 | Temp 98.6°F | Resp 16 | Ht 65.98 in | Wt 156.7 lb

## 2024-04-07 DIAGNOSIS — F419 Anxiety disorder, unspecified: Secondary | ICD-10-CM | POA: Diagnosis not present

## 2024-04-07 MED ORDER — FLUOXETINE HCL 10 MG PO CAPS: 10.0000 mg | ORAL_CAPSULE | Freq: Every day | ORAL | 0 refills | Status: DC

## 2024-04-07 NOTE — Assessment & Plan Note (Signed)
 Chronic. Not well controlled.  Will start Fluoxetine 10mg  daily.   Side effects and benefits of medication discussed. If symptoms improve, can wean down on Wellbutrin.  Follow up in 2 weeks.  Call sooner if concerns arise.

## 2024-04-07 NOTE — Progress Notes (Signed)
 BP 107/66 (BP Location: Left Arm, Patient Position: Sitting, Cuff Size: Large)   Pulse 92   Temp 98.6 F (37 C) (Oral)   Resp 16   Ht 5' 5.98" (1.676 m)   Wt 156 lb 11.2 oz (71.1 kg)   LMP 03/19/2024 (Exact Date)   SpO2 98%   BMI 25.30 kg/m    Subjective:    Patient ID: Marissa Harrington, female    DOB: Aug 25, 2001, 23 y.o.   MRN: [id number]  HPI: Marissa Harrington is a 23 y.o. female  Chief Complaint  Patient presents with   Anxiety    Feels she has had recent panic attacks.    ANXIETY Patient states she feels like the wellbutrin isn't working at all anymore.  States that last week she noticed that her anxiety worsened.  She was not about to start her cycle but when she did recently have one felt like her symptoms were worse at that time.    Relevant past medical, surgical, family and social history reviewed and updated as indicated. Interim medical history since our last visit reviewed. Allergies and medications reviewed and updated.  Review of Systems  Psychiatric/Behavioral:  The patient is nervous/anxious.     Per HPI unless specifically indicated above     Objective:    BP 107/66 (BP Location: Left Arm, Patient Position: Sitting, Cuff Size: Large)   Pulse 92   Temp 98.6 F (37 C) (Oral)   Resp 16   Ht 5' 5.98" (1.676 m)   Wt 156 lb 11.2 oz (71.1 kg)   LMP 03/19/2024 (Exact Date)   SpO2 98%   BMI 25.30 kg/m   Wt Readings from Last 3 Encounters:  04/07/24 156 lb 11.2 oz (71.1 kg)  03/10/24 155 lb (70.3 kg)  12/24/23 155 lb 3.2 oz (70.4 kg)    Physical Exam Vitals and nursing note reviewed.  Constitutional:      General: She is not in acute distress.    Appearance: Normal appearance. She is normal weight. She is not ill-appearing, toxic-appearing or diaphoretic.  HENT:     Head: Normocephalic.     Right Ear: External ear normal.     Left Ear: External ear normal.     Nose: Nose normal.     Mouth/Throat:     Mouth: Mucous membranes are moist.      Pharynx: Oropharynx is clear.  Eyes:     General:        Right eye: No discharge.        Left eye: No discharge.     Extraocular Movements: Extraocular movements intact.     Conjunctiva/sclera: Conjunctivae normal.     Pupils: Pupils are equal, round, and reactive to light.  Cardiovascular:     Rate and Rhythm: Normal rate and regular rhythm.     Heart sounds: No murmur heard. Pulmonary:     Effort: Pulmonary effort is normal. No respiratory distress.     Breath sounds: Normal breath sounds. No wheezing or rales.  Musculoskeletal:     Cervical back: Normal range of motion and neck supple.  Skin:    General: Skin is warm and dry.     Capillary Refill: Capillary refill takes less than 2 seconds.  Neurological:     General: No focal deficit present.     Mental Status: She is alert and oriented to person, place, and time. Mental status is at baseline.  Psychiatric:        Mood and  Affect: Mood normal.        Behavior: Behavior normal.        Thought Content: Thought content normal.        Judgment: Judgment normal.     Results for orders placed or performed during the hospital encounter of 03/10/24  Group A Strep by PCR   Collection Time: 03/10/24  6:32 PM   Specimen: Throat; Sterile Swab  Result Value Ref Range   Group A Strep by PCR NOT DETECTED NOT DETECTED      Assessment & Plan:   Problem List Items Addressed This Visit       Other   Anxiety - Primary   Chronic. Not well controlled.  Will start Fluoxetine 10mg  daily.   Side effects and benefits of medication discussed. If symptoms improve, can wean down on Wellbutrin.  Follow up in 2 weeks.  Call sooner if concerns arise.       Relevant Medications   FLUoxetine (PROZAC) 10 MG capsule     Follow up plan: Return in about 2 weeks (around 04/21/2024) for Depression/Anxiety FU.

## 2024-04-12 ENCOUNTER — Encounter: Payer: Self-pay | Admitting: Nurse Practitioner

## 2024-04-15 MED ORDER — ONDANSETRON HCL 4 MG PO TABS: 4.0000 mg | ORAL_TABLET | Freq: Three times a day (TID) | ORAL | 1 refills | Status: DC | PRN

## 2024-04-24 ENCOUNTER — Ambulatory Visit: Admitting: Nurse Practitioner

## 2024-04-24 ENCOUNTER — Encounter: Payer: Self-pay | Admitting: Nurse Practitioner

## 2024-04-24 VITALS — BP 104/68 | HR 90 | Temp 98.7°F | Ht 65.8 in | Wt 158.4 lb

## 2024-04-24 DIAGNOSIS — F419 Anxiety disorder, unspecified: Secondary | ICD-10-CM | POA: Diagnosis not present

## 2024-04-24 DIAGNOSIS — Z1330 Encounter for screening examination for mental health and behavioral disorders, unspecified: Secondary | ICD-10-CM | POA: Diagnosis not present

## 2024-04-24 MED ORDER — CITALOPRAM HYDROBROMIDE 20 MG PO TABS: 20.0000 mg | ORAL_TABLET | Freq: Every day | ORAL | 0 refills | Status: DC

## 2024-04-24 NOTE — Progress Notes (Signed)
 BP 104/68   Pulse 90   Temp 98.7 F (37.1 C) (Oral)   Ht 5' 5.8" (1.671 m)   Wt 158 lb 6.4 oz (71.8 kg)   LMP 04/17/2024 (Approximate)   SpO2 98%   BMI 25.72 kg/m    Subjective:    Patient ID: Marissa Harrington, female    DOB: [birth year], 23 y.o.   MRN: [id number]  HPI: Marissa Harrington is a 23 y.o. female  Chief Complaint  Patient presents with   Anxiety   ANXIETY Patient states she feels like the fluoxetine  hasn't helped her symptoms.  She is fidgeting a lot with her symptoms.  The fluoxetine  is also causing her nausea.  She would rather change medications to something different than increase the fluoxetine .  Denies SI.  Flowsheet Row Office Visit from 04/24/2024 in [facility]  PHQ-9 Total Score 14         04/24/2024    3:50 PM 04/07/2024    2:53 PM 12/24/2023    3:49 PM 11/05/2023    3:34 PM  GAD 7 : Generalized Anxiety Score  Nervous, Anxious, on Edge 2 3 1  0  Control/stop worrying 2 3 0 0  Worry too much - different things 2 3 0 0  Trouble relaxing 2 3 0 0  Restless 2 2 0 0  Easily annoyed or irritable 2 2 0 0  Afraid - awful might happen 2 2 0 0  Total GAD 7 Score 14 18 1  0  Anxiety Difficulty Somewhat difficult Very difficult        Relevant past medical, surgical, family and social history reviewed and updated as indicated. Interim medical history since our last visit reviewed. Allergies and medications reviewed and updated.  Review of Systems  Psychiatric/Behavioral:  Positive for dysphoric mood. Negative for suicidal ideas. The patient is nervous/anxious.     Per HPI unless specifically indicated above     Objective:    BP 104/68   Pulse 90   Temp 98.7 F (37.1 C) (Oral)   Ht 5' 5.8" (1.671 m)   Wt 158 lb 6.4 oz (71.8 kg)   LMP 04/17/2024 (Approximate)   SpO2 98%   BMI 25.72 kg/m   Wt Readings from Last 3 Encounters:  04/24/24 158 lb 6.4 oz (71.8 kg)  04/07/24 156 lb 11.2 oz (71.1 kg)  03/10/24 155 lb (70.3  kg)    Physical Exam Vitals and nursing note reviewed.  Constitutional:      General: She is not in acute distress.    Appearance: Normal appearance. She is normal weight. She is not ill-appearing, toxic-appearing or diaphoretic.  HENT:     Head: Normocephalic.     Right Ear: External ear normal.     Left Ear: External ear normal.     Nose: Nose normal.     Mouth/Throat:     Mouth: Mucous membranes are moist.     Pharynx: Oropharynx is clear.  Eyes:     General:        Right eye: No discharge.        Left eye: No discharge.     Extraocular Movements: Extraocular movements intact.     Conjunctiva/sclera: Conjunctivae normal.     Pupils: Pupils are equal, round, and reactive to light.  Cardiovascular:     Rate and Rhythm: Normal rate and regular rhythm.     Heart sounds: No murmur heard. Pulmonary:     Effort: Pulmonary effort is  normal. No respiratory distress.     Breath sounds: Normal breath sounds. No wheezing or rales.  Musculoskeletal:     Cervical back: Normal range of motion and neck supple.  Skin:    General: Skin is warm and dry.     Capillary Refill: Capillary refill takes less than 2 seconds.  Neurological:     General: No focal deficit present.     Mental Status: She is alert and oriented to person, place, and time. Mental status is at baseline.  Psychiatric:        Mood and Affect: Mood normal.        Behavior: Behavior normal.        Thought Content: Thought content normal.        Judgment: Judgment normal.     Results for orders placed or performed during the hospital encounter of 03/10/24  Group A Strep by PCR   Collection Time: 03/10/24  6:32 PM   Specimen: Throat; Sterile Swab  Result Value Ref Range   Group A Strep by PCR NOT DETECTED NOT DETECTED      Assessment & Plan:   Problem List Items Addressed This Visit       Other   Anxiety - Primary   Chronic. Not well controlled.  Fluoxetine  did not improve symptoms.  Will change from Fluoxetine   to Citalopram  20mg .  Follow up in 1 week.  Call sooner if concerns arise.       Relevant Medications   citalopram  (CELEXA ) 20 MG tablet     Follow up plan: No follow-ups on file.

## 2024-04-25 NOTE — Assessment & Plan Note (Signed)
 Chronic. Not well controlled.  Fluoxetine  did not improve symptoms.  Will change from Fluoxetine  to Citalopram  20mg .  Follow up in 1 week.  Call sooner if concerns arise.

## 2024-05-01 ENCOUNTER — Encounter: Payer: Self-pay | Admitting: Nurse Practitioner

## 2024-05-01 ENCOUNTER — Ambulatory Visit: Admitting: Nurse Practitioner

## 2024-05-01 VITALS — BP 106/75 | HR 86 | Ht 65.8 in | Wt 159.2 lb

## 2024-05-01 DIAGNOSIS — F419 Anxiety disorder, unspecified: Secondary | ICD-10-CM | POA: Diagnosis not present

## 2024-05-01 MED ORDER — BUPROPION HCL ER (XL) 300 MG PO TB24: 300.0000 mg | ORAL_TABLET | Freq: Every day | ORAL | 1 refills | Status: DC

## 2024-05-01 NOTE — Addendum Note (Signed)
 Addended by: Aileen Alexanders on: 05/01/2024 09:17 AM   Modules accepted: Orders

## 2024-05-01 NOTE — Progress Notes (Signed)
 BP 106/75 (BP Location: Right Arm, Patient Position: Sitting, Cuff Size: Normal)   Pulse 86   Ht 5' 5.8" (1.671 m)   Wt 159 lb 3.2 oz (72.2 kg)   LMP 04/17/2024 (Approximate)   SpO2 98%   BMI 25.85 kg/m    Subjective:    Patient ID: Marissa Harrington, female    DOB: 2001/05/30, 23 y.o.   MRN: [id number]  HPI: Marissa Harrington is a 23 y.o. female  Chief Complaint  Patient presents with   Anxiety    Pt states that she is feeling much better such the switch to Celexa .   ANXIETY Patient states she feels like the Celexa  is improving her symptoms.  She does still feel like she is fidgeting a lot.  She rolls her shoulders constantly.  No longer having the nausea she was having on the Fluoxetine .  She expresses symptoms of possible OCD.  Denies SI.     05/01/2024    8:20 AM 04/24/2024    3:49 PM 04/07/2024    2:53 PM  PHQ9 SCORE ONLY  PHQ-9 Total Score 7 14 14         05/01/2024    8:20 AM 04/24/2024    3:50 PM 04/07/2024    2:53 PM 12/24/2023    3:49 PM  GAD 7 : Generalized Anxiety Score  Nervous, Anxious, on Edge 1 2 3 1   Control/stop worrying 1 2 3  0  Worry too much - different things 1 2 3  0  Trouble relaxing 1 2 3  0  Restless 1 2 2  0  Easily annoyed or irritable 1 2 2  0  Afraid - awful might happen 1 2 2  0  Total GAD 7 Score 7 14 18 1   Anxiety Difficulty Somewhat difficult Somewhat difficult Very difficult       Relevant past medical, surgical, family and social history reviewed and updated as indicated. Interim medical history since our last visit reviewed. Allergies and medications reviewed and updated.  Review of Systems  Psychiatric/Behavioral:  Positive for dysphoric mood. Negative for suicidal ideas. The patient is nervous/anxious.     Per HPI unless specifically indicated above     Objective:    BP 106/75 (BP Location: Right Arm, Patient Position: Sitting, Cuff Size: Normal)   Pulse 86   Ht 5' 5.8" (1.671 m)   Wt 159 lb 3.2 oz (72.2 kg)   LMP  04/17/2024 (Approximate)   SpO2 98%   BMI 25.85 kg/m   Wt Readings from Last 3 Encounters:  05/01/24 159 lb 3.2 oz (72.2 kg)  04/24/24 158 lb 6.4 oz (71.8 kg)  04/07/24 156 lb 11.2 oz (71.1 kg)    Physical Exam Vitals and nursing note reviewed.  Constitutional:      General: She is not in acute distress.    Appearance: Normal appearance. She is normal weight. She is not ill-appearing, toxic-appearing or diaphoretic.  HENT:     Head: Normocephalic.     Right Ear: External ear normal.     Left Ear: External ear normal.     Nose: Nose normal.     Mouth/Throat:     Mouth: Mucous membranes are moist.     Pharynx: Oropharynx is clear.  Eyes:     General:        Right eye: No discharge.        Left eye: No discharge.     Extraocular Movements: Extraocular movements intact.     Conjunctiva/sclera: Conjunctivae normal.  Pupils: Pupils are equal, round, and reactive to light.  Cardiovascular:     Rate and Rhythm: Normal rate and regular rhythm.     Heart sounds: No murmur heard. Pulmonary:     Effort: Pulmonary effort is normal. No respiratory distress.     Breath sounds: Normal breath sounds. No wheezing or rales.  Musculoskeletal:     Cervical back: Normal range of motion and neck supple.  Skin:    General: Skin is warm and dry.     Capillary Refill: Capillary refill takes less than 2 seconds.  Neurological:     General: No focal deficit present.     Mental Status: She is alert and oriented to person, place, and time. Mental status is at baseline.  Psychiatric:        Mood and Affect: Mood normal.        Behavior: Behavior normal.        Thought Content: Thought content normal.        Judgment: Judgment normal.     Results for orders placed or performed during the hospital encounter of 03/10/24  Group A Strep by PCR   Collection Time: 03/10/24  6:32 PM   Specimen: Throat; Sterile Swab  Result Value Ref Range   Group A Strep by PCR NOT DETECTED NOT DETECTED       Assessment & Plan:   Problem List Items Addressed This Visit       Other   Anxiety - Primary   Chronic.  Improved with Celexa  20mg .  Will continue with current dose of medication for 2 more weeks.  IF still doing well, can consider decreasing Wellbutrin  dose to 150mg .  Suspect patient has an OCD component, she was given an OCD assessment to take home and complete and bring back to next visit.  Follow up in 2 weeks.  Call sooner if concerns arise.          Follow up plan: Return May 22 at 8am- okay to double book.

## 2024-05-01 NOTE — Assessment & Plan Note (Signed)
 Chronic.  Improved with Celexa  20mg .  Will continue with current dose of medication for 2 more weeks.  IF still doing well, can consider decreasing Wellbutrin  dose to 150mg .  Suspect patient has an OCD component, she was given an OCD assessment to take home and complete and bring back to next visit.  Follow up in 2 weeks.  Call sooner if concerns arise.

## 2024-05-06 ENCOUNTER — Ambulatory Visit: Admitting: Nurse Practitioner

## 2024-05-15 ENCOUNTER — Encounter: Payer: Self-pay | Admitting: Nurse Practitioner

## 2024-05-15 ENCOUNTER — Ambulatory Visit: Admitting: Nurse Practitioner

## 2024-05-15 VITALS — BP 94/57 | HR 76 | Temp 98.4°F | Resp 15 | Ht 65.79 in | Wt 159.2 lb

## 2024-05-15 DIAGNOSIS — Z6827 Body mass index (BMI) 27.0-27.9, adult: Secondary | ICD-10-CM | POA: Diagnosis not present

## 2024-05-15 DIAGNOSIS — F419 Anxiety disorder, unspecified: Secondary | ICD-10-CM

## 2024-05-15 MED ORDER — PHENTERMINE HCL 15 MG PO CAPS: 15.0000 mg | ORAL_CAPSULE | ORAL | 0 refills | Status: DC

## 2024-05-15 NOTE — Assessment & Plan Note (Signed)
 Chronic.  Evaluation given at last visit indicates OCD.  Doing well with Citalopram  and Wellbutrin . Continue with current medication regimen.  Follow up in 1 month.  Call sooner if concerns arise.

## 2024-05-15 NOTE — Progress Notes (Signed)
 BP (!) 94/57 (BP Location: Left Arm, Patient Position: Sitting, Cuff Size: Normal)   Pulse 76   Temp 98.4 F (36.9 C) (Oral)   Resp 15   Ht 5' 5.79" (1.671 m)   Wt 159 lb 3.2 oz (72.2 kg)   LMP 04/17/2024 (Approximate)   SpO2 98%   BMI 25.86 kg/m    Subjective:    Patient ID: Marissa Harrington, female    DOB: December 09, 2001, 23 y.o.   MRN: [id number]  HPI: Marissa Harrington is a 23 y.o. female  Chief Complaint  Patient presents with   Anxiety    Managing well   ANXIETY Patient states she is feeling a lot better.  She likes the Celexa  20mg  dose.  She also likes the Wellbutrin  at 300mg .  Does not want to wean down on the Wellbutrin  at this time.  She completed the OCD questionnaire and brought it back to her visit today.   Denies SI.     05/15/2024    8:06 AM 05/01/2024    8:20 AM 04/24/2024    3:49 PM  PHQ9 SCORE ONLY  PHQ-9 Total Score 3 7 14         05/15/2024    8:06 AM 05/01/2024    8:20 AM 04/24/2024    3:50 PM 04/07/2024    2:53 PM  GAD 7 : Generalized Anxiety Score  Nervous, Anxious, on Edge 0 1 2 3   Control/stop worrying 0 1 2 3   Worry too much - different things 0 1 2 3   Trouble relaxing 0 1 2 3   Restless 0 1 2 2   Easily annoyed or irritable 0 1 2 2   Afraid - awful might happen 0 1 2 2   Total GAD 7 Score 0 7 14 18   Anxiety Difficulty  Somewhat difficult Somewhat difficult Very difficult    WEIGHT GAIN Duration: years Previous attempts at weight loss: yes Complications of obesity: depression Peak weight: 160lb Weight loss goal: 140lb  Weight loss to date: 6 months Requesting obesity pharmacotherapy: yes Current weight loss supplements/medications: no Previous weight loss supplements/meds: yes- naltrexone  Calories:    Relevant past medical, surgical, family and social history reviewed and updated as indicated. Interim medical history since our last visit reviewed. Allergies and medications reviewed and updated.  Review of Systems  Constitutional:   Positive for unexpected weight change.  Psychiatric/Behavioral:  Positive for dysphoric mood. Negative for suicidal ideas. The patient is nervous/anxious.     Per HPI unless specifically indicated above     Objective:    BP (!) 94/57 (BP Location: Left Arm, Patient Position: Sitting, Cuff Size: Normal)   Pulse 76   Temp 98.4 F (36.9 C) (Oral)   Resp 15   Ht 5' 5.79" (1.671 m)   Wt 159 lb 3.2 oz (72.2 kg)   LMP 04/17/2024 (Approximate)   SpO2 98%   BMI 25.86 kg/m   Wt Readings from Last 3 Encounters:  05/15/24 159 lb 3.2 oz (72.2 kg)  05/01/24 159 lb 3.2 oz (72.2 kg)  04/24/24 158 lb 6.4 oz (71.8 kg)    Physical Exam Vitals and nursing note reviewed.  Constitutional:      General: She is not in acute distress.    Appearance: Normal appearance. She is not ill-appearing, toxic-appearing or diaphoretic.  HENT:     Head: Normocephalic.     Right Ear: External ear normal.     Left Ear: External ear normal.     Nose: Nose normal.  Mouth/Throat:     Mouth: Mucous membranes are moist.     Pharynx: Oropharynx is clear.  Eyes:     General:        Right eye: No discharge.        Left eye: No discharge.     Extraocular Movements: Extraocular movements intact.     Conjunctiva/sclera: Conjunctivae normal.     Pupils: Pupils are equal, round, and reactive to light.  Cardiovascular:     Rate and Rhythm: Normal rate and regular rhythm.     Heart sounds: No murmur heard. Pulmonary:     Effort: Pulmonary effort is normal. No respiratory distress.     Breath sounds: Normal breath sounds. No wheezing or rales.  Musculoskeletal:     Cervical back: Normal range of motion and neck supple.  Skin:    General: Skin is warm and dry.     Capillary Refill: Capillary refill takes less than 2 seconds.  Neurological:     General: No focal deficit present.     Mental Status: She is alert and oriented to person, place, and time. Mental status is at baseline.  Psychiatric:        Mood and  Affect: Mood normal.        Behavior: Behavior normal.        Thought Content: Thought content normal.        Judgment: Judgment normal.     Results for orders placed or performed during the hospital encounter of 03/10/24  Group A Strep by PCR   Collection Time: 03/10/24  6:32 PM   Specimen: Throat; Sterile Swab  Result Value Ref Range   Group A Strep by PCR NOT DETECTED NOT DETECTED      Assessment & Plan:   Problem List Items Addressed This Visit       Other   BMI 27.0-27.9,adult   Chronic.  Weight loss goal of 20lbs.  Will start Phentermine 15mg  daily.  Side effects and benefits of medication discussed during visit.      Anxiety - Primary   Chronic.  Evaluation given at last visit indicates OCD.  Doing well with Citalopram  and Wellbutrin . Continue with current medication regimen.  Follow up in 1 month.  Call sooner if concerns arise.           Follow up plan: Return in about 1 month (around 06/15/2024) for Medication Management.

## 2024-05-15 NOTE — Assessment & Plan Note (Signed)
 Chronic.  Weight loss goal of 20lbs.  Will start Phentermine 15mg  daily.  Side effects and benefits of medication discussed during visit.

## 2024-05-24 ENCOUNTER — Encounter: Payer: Self-pay | Admitting: Nurse Practitioner

## 2024-05-26 MED ORDER — CITALOPRAM HYDROBROMIDE 40 MG PO TABS: 40.0000 mg | ORAL_TABLET | Freq: Every day | ORAL | 0 refills | Status: DC

## 2024-06-17 ENCOUNTER — Ambulatory Visit: Admitting: Nurse Practitioner

## 2024-06-17 ENCOUNTER — Encounter: Payer: Self-pay | Admitting: Nurse Practitioner

## 2024-06-17 VITALS — BP 108/72 | HR 91 | Ht 65.5 in | Wt 160.0 lb

## 2024-06-17 DIAGNOSIS — F419 Anxiety disorder, unspecified: Secondary | ICD-10-CM

## 2024-06-17 MED ORDER — BUPROPION HCL ER (XL) 300 MG PO TB24: 300.0000 mg | ORAL_TABLET | Freq: Every day | ORAL | 1 refills | Status: DC

## 2024-06-17 MED ORDER — ONDANSETRON HCL 4 MG PO TABS: 4.0000 mg | ORAL_TABLET | Freq: Three times a day (TID) | ORAL | 1 refills | Status: DC | PRN

## 2024-06-17 MED ORDER — CITALOPRAM HYDROBROMIDE 20 MG PO TABS: 20.0000 mg | ORAL_TABLET | Freq: Every day | ORAL | 0 refills | Status: DC

## 2024-06-17 NOTE — Progress Notes (Unsigned)
 BP 108/72   Pulse 91   Ht 5' 5.5 (1.664 m)   Wt 160 lb (72.6 kg)   BMI 26.22 kg/m    Subjective:    Patient ID: Marissa Harrington, female    DOB: August 24, 2001, 23 y.o.   MRN: [id number]  HPI: Marissa Harrington is a 23 y.o. female  Chief Complaint  Patient presents with   Anxiety   ANXIETY Patient states she feels like the medication isn't helping her symptoms.  States it was really bad on Sunday.  States she started feeling really scared when she was at the grocery store and since then it just hasn't settled down.  She is currently taking She also likes the Wellbutrin  at 300mg  and citalopram  40mg .  Stopped taking the phentermine  on Sunday.      06/17/2024    3:36 PM 05/15/2024    8:06 AM 05/01/2024    8:20 AM  PHQ9 SCORE ONLY  PHQ-9 Total Score 21 3 7         06/17/2024    3:36 PM 05/15/2024    8:06 AM 05/01/2024    8:20 AM 04/24/2024    3:50 PM  GAD 7 : Generalized Anxiety Score  Nervous, Anxious, on Edge 2 0 1 2  Control/stop worrying 2 0 1 2  Worry too much - different things 2 0 1 2  Trouble relaxing 2 0 1 2  Restless 2 0 1 2  Easily annoyed or irritable 2 0 1 2  Afraid - awful might happen 2 0 1 2  Total GAD 7 Score 14 0 7 14  Anxiety Difficulty   Somewhat difficult Somewhat difficult    WEIGHT GAIN Duration: years Previous attempts at weight loss: yes Complications of obesity: depression Peak weight: 160lb Weight loss goal: 140lb  Weight loss to date: 6 months Requesting obesity pharmacotherapy: yes Current weight loss supplements/medications: no Previous weight loss supplements/meds: yes- naltrexone  Calories:    Relevant past medical, surgical, family and social history reviewed and updated as indicated. Interim medical history since our last visit reviewed. Allergies and medications reviewed and updated.  Review of Systems  Constitutional:  Positive for unexpected weight change.  Psychiatric/Behavioral:  Positive for dysphoric mood. Negative for  suicidal ideas. The patient is nervous/anxious.     Per HPI unless specifically indicated above     Objective:    BP 108/72   Pulse 91   Ht 5' 5.5 (1.664 m)   Wt 160 lb (72.6 kg)   BMI 26.22 kg/m   Wt Readings from Last 3 Encounters:  06/17/24 160 lb (72.6 kg)  05/15/24 159 lb 3.2 oz (72.2 kg)  05/01/24 159 lb 3.2 oz (72.2 kg)    Physical Exam Vitals and nursing note reviewed.  Constitutional:      General: She is not in acute distress.    Appearance: Normal appearance. She is not ill-appearing, toxic-appearing or diaphoretic.  HENT:     Head: Normocephalic.     Right Ear: External ear normal.     Left Ear: External ear normal.     Nose: Nose normal.     Mouth/Throat:     Mouth: Mucous membranes are moist.     Pharynx: Oropharynx is clear.   Eyes:     General:        Right eye: No discharge.        Left eye: No discharge.     Extraocular Movements: Extraocular movements intact.     Conjunctiva/sclera: Conjunctivae  normal.     Pupils: Pupils are equal, round, and reactive to light.    Cardiovascular:     Rate and Rhythm: Normal rate and regular rhythm.     Heart sounds: No murmur heard. Pulmonary:     Effort: Pulmonary effort is normal. No respiratory distress.     Breath sounds: Normal breath sounds. No wheezing or rales.   Musculoskeletal:     Cervical back: Normal range of motion and neck supple.   Skin:    General: Skin is warm and dry.     Capillary Refill: Capillary refill takes less than 2 seconds.   Neurological:     General: No focal deficit present.     Mental Status: She is alert and oriented to person, place, and time. Mental status is at baseline.   Psychiatric:        Mood and Affect: Mood normal.        Behavior: Behavior normal.        Thought Content: Thought content normal.        Judgment: Judgment normal.     Results for orders placed or performed during the hospital encounter of 03/10/24  Group A Strep by PCR   Collection  Time: 03/10/24  6:32 PM   Specimen: Throat; Sterile Swab  Result Value Ref Range   Group A Strep by PCR NOT DETECTED NOT DETECTED      Assessment & Plan:   Problem List Items Addressed This Visit       Other   Anxiety - Primary       Follow up plan: No follow-ups on file.

## 2024-06-18 ENCOUNTER — Ambulatory Visit: Admitting: Nurse Practitioner

## 2024-06-18 NOTE — Assessment & Plan Note (Signed)
 Chronic. Not well controlled.  Will increase Citalopram  to 60mg  daily.  Continue with Wellbutrin .  Stop phentermine  at this time.  Follow up in 1 week. Call sooner if concerns arise.

## 2024-06-19 ENCOUNTER — Encounter: Payer: Self-pay | Admitting: Nurse Practitioner

## 2024-06-26 ENCOUNTER — Ambulatory Visit: Admitting: Nurse Practitioner

## 2024-07-10 ENCOUNTER — Ambulatory Visit: Admitting: Nurse Practitioner

## 2024-07-10 ENCOUNTER — Other Ambulatory Visit: Payer: Self-pay | Admitting: Nurse Practitioner

## 2024-07-10 ENCOUNTER — Encounter: Payer: Self-pay | Admitting: Nurse Practitioner

## 2024-07-10 VITALS — BP 107/69 | HR 90 | Temp 97.8°F | Wt 159.4 lb

## 2024-07-10 DIAGNOSIS — F419 Anxiety disorder, unspecified: Secondary | ICD-10-CM | POA: Diagnosis not present

## 2024-07-10 NOTE — Assessment & Plan Note (Signed)
 Chronic. Improved.  Continue with Citalopram  60mg  and Wellbutrin  150mg .  Follow up in 6 weeks.  Can consider decreasing dose at that time.  Will discus with patient.

## 2024-07-10 NOTE — Progress Notes (Signed)
 BP 107/69   Pulse 90   Temp 97.8 F (36.6 C) (Oral)   Wt 159 lb 6.4 oz (72.3 kg)   LMP 06/15/2024   SpO2 99%   BMI 26.12 kg/m    Subjective:    Patient ID: Marissa Harrington Drafts, female    DOB: 2001-09-13, 23 y.o.   MRN: [id number]  HPI: Marissa Harrington is a 23 y.o. female  Chief Complaint  Patient presents with   Anxiety   ANXIETY Patient states she feels like the medication is helping her.  She would like to continue with the citalopram  60mg  and Wellbutrin  300mg .  She has not been taking the phentermine .  Her scores have significantly improved from last visit.      07/10/2024    9:48 AM 06/17/2024    3:36 PM 05/15/2024    8:06 AM  PHQ9 SCORE ONLY  PHQ-9 Total Score 0 21 3        07/10/2024    9:48 AM 06/17/2024    3:36 PM 05/15/2024    8:06 AM 05/01/2024    8:20 AM  GAD 7 : Generalized Anxiety Score  Nervous, Anxious, on Edge 0 2 0 1  Control/stop worrying 0 2 0 1  Worry too much - different things 0 2 0 1  Trouble relaxing 0 2 0 1  Restless 0 2 0 1  Easily annoyed or irritable 0 2 0 1  Afraid - awful might happen 0 2 0 1  Total GAD 7 Score 0 14 0 7  Anxiety Difficulty Not difficult at all   Somewhat difficult      Relevant past medical, surgical, family and social history reviewed and updated as indicated. Interim medical history since our last visit reviewed. Allergies and medications reviewed and updated.  Review of Systems  Psychiatric/Behavioral:  Positive for dysphoric mood. Negative for suicidal ideas. The patient is nervous/anxious.     Per HPI unless specifically indicated above     Objective:    BP 107/69   Pulse 90   Temp 97.8 F (36.6 C) (Oral)   Wt 159 lb 6.4 oz (72.3 kg)   LMP 06/15/2024   SpO2 99%   BMI 26.12 kg/m   Wt Readings from Last 3 Encounters:  07/10/24 159 lb 6.4 oz (72.3 kg)  06/17/24 160 lb (72.6 kg)  05/15/24 159 lb 3.2 oz (72.2 kg)    Physical Exam Vitals and nursing note reviewed.  Constitutional:      General:  She is not in acute distress.    Appearance: Normal appearance. She is not ill-appearing, toxic-appearing or diaphoretic.  HENT:     Head: Normocephalic.     Right Ear: External ear normal.     Left Ear: External ear normal.     Nose: Nose normal.     Mouth/Throat:     Mouth: Mucous membranes are moist.     Pharynx: Oropharynx is clear.  Eyes:     General:        Right eye: No discharge.        Left eye: No discharge.     Extraocular Movements: Extraocular movements intact.     Conjunctiva/sclera: Conjunctivae normal.     Pupils: Pupils are equal, round, and reactive to light.  Cardiovascular:     Rate and Rhythm: Normal rate and regular rhythm.     Heart sounds: No murmur heard. Pulmonary:     Effort: Pulmonary effort is normal. No respiratory distress.  Breath sounds: Normal breath sounds. No wheezing or rales.  Musculoskeletal:     Cervical back: Normal range of motion and neck supple.  Skin:    General: Skin is warm and dry.     Capillary Refill: Capillary refill takes less than 2 seconds.  Neurological:     General: No focal deficit present.     Mental Status: She is alert and oriented to person, place, and time. Mental status is at baseline.  Psychiatric:        Mood and Affect: Mood normal.        Behavior: Behavior normal.        Thought Content: Thought content normal.        Judgment: Judgment normal.     Results for orders placed or performed during the hospital encounter of 03/10/24  Group A Strep by PCR   Collection Time: 03/10/24  6:32 PM   Specimen: Throat; Sterile Swab  Result Value Ref Range   Group A Strep by PCR NOT DETECTED NOT DETECTED      Assessment & Plan:   Problem List Items Addressed This Visit       Other   Anxiety - Primary   Chronic. Improved.  Continue with Citalopram  60mg  and Wellbutrin  150mg .  Follow up in 6 weeks.  Can consider decreasing dose at that time.  Will discus with patient.          Follow up plan: Return in  about 6 weeks (around 08/21/2024) for Depression/Anxiety FU.

## 2024-07-11 NOTE — Telephone Encounter (Signed)
 Requested Prescriptions  Pending Prescriptions Disp Refills   citalopram  (CELEXA ) 20 MG tablet [Pharmacy Med Name: CITALOPRAM  HBR 20 MG TABLET] 30 tablet 1    Sig: TAKE 1 TABLET BY MOUTH EVERY DAY     Psychiatry:  Antidepressants - SSRI Passed - 07/11/2024  2:28 PM      Passed - Valid encounter within last 6 months    Recent Outpatient Visits           Yesterday Anxiety   [place] [facility] Melvin Pao, NP   3 weeks ago Anxiety   [place] [facility] Melvin Pao, NP   1 month ago Anxiety   [place] [facility] Melvin Pao, NP   2 months ago Anxiety   [place] [facility] Melvin Pao, NP   2 months ago Anxiety   [place] [facility] Melvin Pao, NP

## 2024-07-14 ENCOUNTER — Encounter: Payer: Self-pay | Admitting: Nurse Practitioner

## 2024-07-15 MED ORDER — BUPROPION HCL ER (XL) 300 MG PO TB24: 300.0000 mg | ORAL_TABLET | Freq: Every day | ORAL | 1 refills | Status: DC

## 2024-07-15 MED ORDER — CITALOPRAM HYDROBROMIDE 40 MG PO TABS: 40.0000 mg | ORAL_TABLET | Freq: Every day | ORAL | 1 refills | Status: DC

## 2024-07-15 MED ORDER — CITALOPRAM HYDROBROMIDE 20 MG PO TABS: 20.0000 mg | ORAL_TABLET | Freq: Every day | ORAL | 1 refills | Status: DC

## 2024-07-15 MED ORDER — ONDANSETRON HCL 4 MG PO TABS
4.0000 mg | ORAL_TABLET | Freq: Three times a day (TID) | ORAL | 1 refills | Status: DC | PRN
Start: 2024-07-15 — End: 2024-10-23

## 2024-08-17 ENCOUNTER — Encounter: Payer: Self-pay | Admitting: Nurse Practitioner

## 2024-08-26 ENCOUNTER — Ambulatory Visit: Admitting: Nurse Practitioner

## 2024-09-01 ENCOUNTER — Ambulatory Visit: Admitting: Nurse Practitioner

## 2024-09-01 ENCOUNTER — Encounter: Payer: Self-pay | Admitting: Nurse Practitioner

## 2024-09-01 VITALS — BP 108/68 | HR 114 | Temp 98.2°F | Wt 165.0 lb

## 2024-09-01 DIAGNOSIS — R0981 Nasal congestion: Secondary | ICD-10-CM | POA: Diagnosis not present

## 2024-09-01 MED ORDER — METHYLPREDNISOLONE 4 MG PO TBPK: ORAL_TABLET | ORAL | 0 refills | Status: DC

## 2024-09-01 NOTE — Progress Notes (Signed)
 BP 108/68   Pulse (!) 114   Temp 98.2 F (36.8 C) (Oral)   Wt 165 lb (74.8 kg)   LMP 08/13/2024 (Approximate)   SpO2 98%   BMI 27.04 kg/m    Subjective:    Patient ID: Marissa Harrington Drafts, female    DOB: 2001/04/21, 23 y.o.   MRN: [id number]  HPI: Marissa Harrington is a 23 y.o. female  Chief Complaint  Patient presents with   Sinusitis    Patient states she has been having trouble in nasal congestion and sinus pressure for the last 3 weeks. States she has tried nasal rinses that helped some but then the congestion came back.    Patient states she is having nasal congestion and runny nose for the last three weeks.  She is taking Claritin, Flonase , and nasal rinses.  The medications help some but then the symptoms come right back.  Denies fever, ear pain, sore throat, and headaches.      Relevant past medical, surgical, family and social history reviewed and updated as indicated. Interim medical history since our last visit reviewed. Allergies and medications reviewed and updated.  Review of Systems  Constitutional:  Negative for fever.  HENT:  Positive for congestion. Negative for ear pain, sinus pain and sore throat.     Per HPI unless specifically indicated above     Objective:    BP 108/68   Pulse (!) 114   Temp 98.2 F (36.8 C) (Oral)   Wt 165 lb (74.8 kg)   LMP 08/13/2024 (Approximate)   SpO2 98%   BMI 27.04 kg/m   Wt Readings from Last 3 Encounters:  09/01/24 165 lb (74.8 kg)  07/10/24 159 lb 6.4 oz (72.3 kg)  06/17/24 160 lb (72.6 kg)    Physical Exam Vitals and nursing note reviewed.  Constitutional:      General: She is not in acute distress.    Appearance: Normal appearance. She is normal weight. She is not ill-appearing, toxic-appearing or diaphoretic.  HENT:     Head: Normocephalic.     Right Ear: Tympanic membrane and external ear normal.     Left Ear: Tympanic membrane and external ear normal.     Nose: Rhinorrhea present.     Mouth/Throat:      Mouth: Mucous membranes are moist.     Pharynx: Oropharynx is clear. Posterior oropharyngeal erythema present. No oropharyngeal exudate.  Eyes:     General:        Right eye: No discharge.        Left eye: No discharge.     Extraocular Movements: Extraocular movements intact.     Conjunctiva/sclera: Conjunctivae normal.     Pupils: Pupils are equal, round, and reactive to light.  Cardiovascular:     Rate and Rhythm: Normal rate and regular rhythm.     Heart sounds: No murmur heard. Pulmonary:     Effort: Pulmonary effort is normal. No respiratory distress.     Breath sounds: Normal breath sounds. No wheezing or rales.  Musculoskeletal:     Cervical back: Normal range of motion and neck supple.  Skin:    General: Skin is warm and dry.     Capillary Refill: Capillary refill takes less than 2 seconds.  Neurological:     General: No focal deficit present.     Mental Status: She is alert and oriented to person, place, and time. Mental status is at baseline.  Psychiatric:  Mood and Affect: Mood normal.        Behavior: Behavior normal.        Thought Content: Thought content normal.        Judgment: Judgment normal.     Results for orders placed or performed during the hospital encounter of 03/10/24  Group A Strep by PCR   Collection Time: 03/10/24  6:32 PM   Specimen: Throat; Sterile Swab  Result Value Ref Range   Group A Strep by PCR NOT DETECTED NOT DETECTED      Assessment & Plan:   Problem List Items Addressed This Visit   None Visit Diagnoses       Nasal congestion    -  Primary   Continue with Claritin, Flonase  and nasal rinses. Will treat with Medrol  dose pak.  Complete course of medication and follow up if not improved.        Follow up plan: No follow-ups on file.

## 2024-09-11 ENCOUNTER — Ambulatory Visit: Admitting: Nurse Practitioner

## 2024-09-11 ENCOUNTER — Encounter: Payer: Self-pay | Admitting: Nurse Practitioner

## 2024-09-11 VITALS — BP 108/69 | HR 92 | Temp 98.4°F | Wt 167.8 lb

## 2024-09-11 DIAGNOSIS — Z6827 Body mass index (BMI) 27.0-27.9, adult: Secondary | ICD-10-CM | POA: Diagnosis not present

## 2024-09-11 DIAGNOSIS — F419 Anxiety disorder, unspecified: Secondary | ICD-10-CM

## 2024-09-11 MED ORDER — PHENTERMINE HCL 15 MG PO CAPS: 15.0000 mg | ORAL_CAPSULE | ORAL | 0 refills | Status: DC

## 2024-09-11 NOTE — Assessment & Plan Note (Signed)
 Chronic.  Will start Phentermine  again.  Side effects and benefits of medication discussed during visit today.  Recommend diet and exercise.  Follow up in 1 month.  Call sooner if concerns arise.

## 2024-09-11 NOTE — Assessment & Plan Note (Signed)
 Chronic.  Controlled.  Continue with current medication regimen of Citalopram  60mg  and Wellbutrin  300mg . Return to clinic in 6 months for reevaluation.  Call sooner if concerns arise.

## 2024-09-11 NOTE — Progress Notes (Signed)
 BP 108/69   Pulse 92   Temp 98.4 F (36.9 C) (Oral)   Wt 167 lb 12.8 oz (76.1 kg)   LMP 09/10/2024 (Exact Date)   SpO2 98%   BMI 27.50 kg/m    Subjective:    Patient ID: Marissa Harrington, female    DOB: 2001-12-02, 23 y.o.   MRN: [id number]  HPI: Marissa Harrington is a 23 y.o. female  Chief Complaint  Patient presents with   Anxiety   ANXIETY Patient states she is feeling a lot better.  She likes the Celexa  60mg  dose.  She also likes the Wellbutrin  at 300mg .  Does not want to wean down on the Wellbutrin  at this time. Feels like this is a good regimen for her. Denies SI.     09/11/2024    8:11 AM 07/10/2024    9:48 AM 06/17/2024    3:36 PM  PHQ9 SCORE ONLY  PHQ-9 Total Score 0 0  21      Data saved with a previous flowsheet row definition        09/11/2024    8:11 AM 07/10/2024    9:48 AM 06/17/2024    3:36 PM 05/15/2024    8:06 AM  GAD 7 : Generalized Anxiety Score  Nervous, Anxious, on Edge 0 0 2 0  Control/stop worrying 0 0 2 0  Worry too much - different things 0 0 2 0  Trouble relaxing 0 0 2 0  Restless 0 0 2 0  Easily annoyed or irritable 0 0 2 0  Afraid - awful might happen 0 0 2 0  Total GAD 7 Score 0 0 14 0  Anxiety Difficulty Not difficult at all Not difficult at all      WEIGHT GAIN Has tried Phentermine  once in the past but anxiety increased.  Unclear of whether it was from the medication or not.  Would like to retry medication to wait in weight loss.  Duration: years Previous attempts at weight loss: yes Complications of obesity: depression Peak weight: 160lb Weight loss goal: 140lb  Weight loss to date: 6 months Requesting obesity pharmacotherapy: yes Current weight loss supplements/medications: no Previous weight loss supplements/meds: yes- naltrexone  Calories:    Relevant past medical, surgical, family and social history reviewed and updated as indicated. Interim medical history since our last visit reviewed. Allergies and medications  reviewed and updated.  Review of Systems  Constitutional:  Positive for unexpected weight change.  Psychiatric/Behavioral:  Positive for dysphoric mood. Negative for suicidal ideas. The patient is nervous/anxious.     Per HPI unless specifically indicated above     Objective:    BP 108/69   Pulse 92   Temp 98.4 F (36.9 C) (Oral)   Wt 167 lb 12.8 oz (76.1 kg)   LMP 09/10/2024 (Exact Date)   SpO2 98%   BMI 27.50 kg/m   Wt Readings from Last 3 Encounters:  09/11/24 167 lb 12.8 oz (76.1 kg)  09/01/24 165 lb (74.8 kg)  07/10/24 159 lb 6.4 oz (72.3 kg)    Physical Exam Vitals and nursing note reviewed.  Constitutional:      General: She is not in acute distress.    Appearance: Normal appearance. She is not ill-appearing, toxic-appearing or diaphoretic.  HENT:     Head: Normocephalic.     Right Ear: External ear normal.     Left Ear: External ear normal.     Nose: Nose normal.     Mouth/Throat:  Mouth: Mucous membranes are moist.     Pharynx: Oropharynx is clear.  Eyes:     General:        Right eye: No discharge.        Left eye: No discharge.     Extraocular Movements: Extraocular movements intact.     Conjunctiva/sclera: Conjunctivae normal.     Pupils: Pupils are equal, round, and reactive to light.  Cardiovascular:     Rate and Rhythm: Normal rate and regular rhythm.     Heart sounds: No murmur heard. Pulmonary:     Effort: Pulmonary effort is normal. No respiratory distress.     Breath sounds: Normal breath sounds. No wheezing or rales.  Musculoskeletal:     Cervical back: Normal range of motion and neck supple.  Skin:    General: Skin is warm and dry.     Capillary Refill: Capillary refill takes less than 2 seconds.  Neurological:     General: No focal deficit present.     Mental Status: She is alert and oriented to person, place, and time. Mental status is at baseline.  Psychiatric:        Mood and Affect: Mood normal.        Behavior: Behavior  normal.        Thought Content: Thought content normal.        Judgment: Judgment normal.     Results for orders placed or performed during the hospital encounter of 03/10/24  Group A Strep by PCR   Collection Time: 03/10/24  6:32 PM   Specimen: Throat; Sterile Swab  Result Value Ref Range   Group A Strep by PCR NOT DETECTED NOT DETECTED      Assessment & Plan:   Problem List Items Addressed This Visit       Other   Anxiety - Primary       Follow up plan: No follow-ups on file.

## 2024-09-20 ENCOUNTER — Encounter: Payer: Self-pay | Admitting: Nurse Practitioner

## 2024-09-22 NOTE — Telephone Encounter (Signed)
 Called patient and left a message for her to call back to get scheduled.

## 2024-09-23 NOTE — Telephone Encounter (Signed)
 Called patient and left a message to call back to get scheduled.

## 2024-10-23 ENCOUNTER — Encounter: Payer: Self-pay | Admitting: Nurse Practitioner

## 2024-10-23 ENCOUNTER — Ambulatory Visit: Admitting: Nurse Practitioner

## 2024-10-23 VITALS — BP 102/68 | HR 103 | Temp 98.2°F | Ht 65.7 in | Wt 171.2 lb

## 2024-10-23 DIAGNOSIS — Z6827 Body mass index (BMI) 27.0-27.9, adult: Secondary | ICD-10-CM

## 2024-10-23 MED ORDER — ONDANSETRON HCL 4 MG PO TABS: 4.0000 mg | ORAL_TABLET | Freq: Three times a day (TID) | ORAL | 1 refills | Status: DC | PRN

## 2024-10-23 MED ORDER — PHENTERMINE HCL 30 MG PO CAPS: 30.0000 mg | ORAL_CAPSULE | ORAL | 0 refills | Status: AC

## 2024-10-23 NOTE — Progress Notes (Signed)
 BP 102/68   Pulse (!) 103   Temp 98.2 F (36.8 C) (Oral)   Ht 5' 5.7 (1.669 m)   Wt 171 lb 3.2 oz (77.7 kg)   SpO2 98%   BMI 27.89 kg/m    Subjective:    Patient ID: Marissa Harrington, female    DOB: 2001/04/28, 23 y.o.   MRN: [id number]  HPI: Marissa Harrington is a 23 y.o. female  Chief Complaint  Patient presents with   Weight Check    WEIGHT GAIN Phentermine  is not helping with appetite suppression.  She has gained 3-4lbs since last visit.  Is interested in increase the dose of medication.  Duration: years Previous attempts at weight loss: yes Complications of obesity: depression Peak weight: 170lb Weight loss goal: 140lb  Weight loss to date: 6 months Requesting obesity pharmacotherapy: yes Current weight loss supplements/medications: no Previous weight loss supplements/meds: yes- naltrexone  Calories:    Relevant past medical, surgical, family and social history reviewed and updated as indicated. Interim medical history since our last visit reviewed. Allergies and medications reviewed and updated.  Review of Systems  Constitutional:  Positive for unexpected weight change.  Psychiatric/Behavioral:  Positive for dysphoric mood. Negative for suicidal ideas. The patient is nervous/anxious.     Per HPI unless specifically indicated above     Objective:    BP 102/68   Pulse (!) 103   Temp 98.2 F (36.8 C) (Oral)   Ht 5' 5.7 (1.669 m)   Wt 171 lb 3.2 oz (77.7 kg)   SpO2 98%   BMI 27.89 kg/m   Wt Readings from Last 3 Encounters:  10/23/24 171 lb 3.2 oz (77.7 kg)  09/11/24 167 lb 12.8 oz (76.1 kg)  09/01/24 165 lb (74.8 kg)    Physical Exam Vitals and nursing note reviewed.  Constitutional:      General: She is not in acute distress.    Appearance: Normal appearance. She is not ill-appearing, toxic-appearing or diaphoretic.  HENT:     Head: Normocephalic.     Right Ear: External ear normal.     Left Ear: External ear normal.     Nose: Nose  normal.     Mouth/Throat:     Mouth: Mucous membranes are moist.     Pharynx: Oropharynx is clear.  Eyes:     General:        Right eye: No discharge.        Left eye: No discharge.     Extraocular Movements: Extraocular movements intact.     Conjunctiva/sclera: Conjunctivae normal.     Pupils: Pupils are equal, round, and reactive to light.  Cardiovascular:     Rate and Rhythm: Normal rate and regular rhythm.     Heart sounds: No murmur heard. Pulmonary:     Effort: Pulmonary effort is normal. No respiratory distress.     Breath sounds: Normal breath sounds. No wheezing or rales.  Musculoskeletal:     Cervical back: Normal range of motion and neck supple.  Skin:    General: Skin is warm and dry.     Capillary Refill: Capillary refill takes less than 2 seconds.  Neurological:     General: No focal deficit present.     Mental Status: She is alert and oriented to person, place, and time. Mental status is at baseline.  Psychiatric:        Mood and Affect: Mood normal.        Behavior: Behavior normal.  Thought Content: Thought content normal.        Judgment: Judgment normal.     Results for orders placed or performed during the hospital encounter of 03/10/24  Group A Strep by PCR   Collection Time: 03/10/24  6:32 PM   Specimen: Throat; Sterile Swab  Result Value Ref Range   Group A Strep by PCR NOT DETECTED NOT DETECTED      Assessment & Plan:   Problem List Items Addressed This Visit       Other   BMI 27.0-27.9,adult - Primary   Chronic.  Will increase Phentermine  to 30mg .  Side effects and benefits of medication discussed during visit today.  Recommend diet and exercise.  Follow up in 1 month.  Call sooner if concerns arise.            Follow up plan: Return in about 1 month (around 11/23/2024) for Weight Managment.

## 2024-10-23 NOTE — Assessment & Plan Note (Signed)
 Chronic.  Will increase Phentermine  to 30mg .  Side effects and benefits of medication discussed during visit today.  Recommend diet and exercise.  Follow up in 1 month.  Call sooner if concerns arise.

## 2024-10-29 ENCOUNTER — Encounter: Payer: Self-pay | Admitting: Nurse Practitioner

## 2024-11-16 ENCOUNTER — Encounter: Payer: Self-pay | Admitting: Nurse Practitioner

## 2024-11-17 MED ORDER — ONDANSETRON HCL 4 MG PO TABS: 4.0000 mg | ORAL_TABLET | Freq: Three times a day (TID) | ORAL | 1 refills | Status: AC | PRN

## 2024-11-26 ENCOUNTER — Encounter: Payer: Self-pay | Admitting: Nurse Practitioner

## 2024-11-26 ENCOUNTER — Ambulatory Visit: Admitting: Nurse Practitioner

## 2024-11-26 MED ORDER — BUPROPION HCL ER (XL) 300 MG PO TB24: 300.0000 mg | ORAL_TABLET | Freq: Every day | ORAL | 1 refills | Status: DC

## 2024-11-26 MED ORDER — BUPROPION HCL ER (XL) 300 MG PO TB24: 300.0000 mg | ORAL_TABLET | Freq: Every day | ORAL | 0 refills | Status: AC

## 2024-11-30 ENCOUNTER — Encounter: Payer: Self-pay | Admitting: Nurse Practitioner

## 2024-12-22 ENCOUNTER — Encounter: Payer: Self-pay | Admitting: Nurse Practitioner

## 2024-12-22 MED ORDER — CITALOPRAM HYDROBROMIDE 20 MG PO TABS: 20.0000 mg | ORAL_TABLET | Freq: Every day | ORAL | 1 refills | Status: AC

## 2024-12-22 MED ORDER — CITALOPRAM HYDROBROMIDE 40 MG PO TABS: 40.0000 mg | ORAL_TABLET | Freq: Every day | ORAL | 1 refills | Status: DC

## 2024-12-24 ENCOUNTER — Ambulatory Visit: Admitting: Nurse Practitioner

## 2025-01-14 ENCOUNTER — Other Ambulatory Visit: Payer: Self-pay | Admitting: Nurse Practitioner

## 2025-01-15 NOTE — Telephone Encounter (Signed)
 Need to contact pharmacy to verify need for mail order in order for insurance to cover

## 2025-01-15 NOTE — Telephone Encounter (Signed)
 Sending to mail order d/t insurance not cover at local pharmacy  Requested Prescriptions  Pending Prescriptions Disp Refills   citalopram  (CELEXA ) 40 MG tablet [Pharmacy Med Name: CITALOPRAM  HBR 40 MG TABLET] 90 tablet 2    Sig: TAKE 1 TABLET BY MOUTH EVERY DAY *NOT COVERED, MUST USE MAIL ORDER SERVICE PER INSURANCE*     Psychiatry:  Antidepressants - SSRI Passed - 01/15/2025 10:58 AM      Passed - Valid encounter within last 6 months    Recent Outpatient Visits           2 months ago BMI 27.0-27.9,adult   [place] [facility] Melvin Pao, NP   4 months ago Anxiety   [place] [facility] Melvin Pao, NP   4 months ago Nasal congestion   [place] [facility] Melvin Pao, NP   6 months ago Anxiety   [place] [facility] Melvin Pao, NP   7 months ago Anxiety   [place] [facility] Melvin Pao, NP

## 2025-01-28 ENCOUNTER — Ambulatory Visit: Admitting: Nurse Practitioner
